# Patient Record
Sex: Female | Born: 1960 | Race: Black or African American | Hispanic: No | Marital: Married | Smoking: Never smoker
Health system: Southern US, Community
[De-identification: ages and names within clinical notes are randomized; demographics above are authoritative.]

## PROBLEM LIST (undated history)

## (undated) DIAGNOSIS — D72820 Lymphocytosis (symptomatic): Secondary | ICD-10-CM

## (undated) DIAGNOSIS — E119 Type 2 diabetes mellitus without complications: Secondary | ICD-10-CM

## (undated) DIAGNOSIS — K3184 Gastroparesis: Secondary | ICD-10-CM

## (undated) DIAGNOSIS — M1990 Unspecified osteoarthritis, unspecified site: Secondary | ICD-10-CM

## (undated) DIAGNOSIS — J309 Allergic rhinitis, unspecified: Secondary | ICD-10-CM

## (undated) DIAGNOSIS — K5790 Diverticulosis of intestine, part unspecified, without perforation or abscess without bleeding: Secondary | ICD-10-CM

## (undated) DIAGNOSIS — E785 Hyperlipidemia, unspecified: Secondary | ICD-10-CM

## (undated) DIAGNOSIS — K219 Gastro-esophageal reflux disease without esophagitis: Secondary | ICD-10-CM

## (undated) DIAGNOSIS — G43909 Migraine, unspecified, not intractable, without status migrainosus: Secondary | ICD-10-CM

## (undated) DIAGNOSIS — I10 Essential (primary) hypertension: Secondary | ICD-10-CM

## (undated) DIAGNOSIS — J45909 Unspecified asthma, uncomplicated: Secondary | ICD-10-CM

## (undated) HISTORY — DX: Hyperlipidemia, unspecified: E78.5

## (undated) HISTORY — DX: Gastroparesis: K31.84

## (undated) HISTORY — DX: Lymphocytosis (symptomatic): D72.820

## (undated) HISTORY — DX: Gastro-esophageal reflux disease without esophagitis: K21.9

## (undated) HISTORY — DX: Allergic rhinitis, unspecified: J30.9

## (undated) HISTORY — DX: Unspecified osteoarthritis, unspecified site: M19.90

## (undated) HISTORY — DX: Diverticulosis of intestine, part unspecified, without perforation or abscess without bleeding: K57.90

---

## 1998-01-18 HISTORY — PX: KNEE SURGERY: SHX244

## 1999-02-17 ENCOUNTER — Other Ambulatory Visit: Admission: RE | Admit: 1999-02-17 | Discharge: 1999-02-17 | Payer: Self-pay | Admitting: Obstetrics and Gynecology

## 2001-07-24 ENCOUNTER — Emergency Department (HOSPITAL_COMMUNITY): Admission: EM | Admit: 2001-07-24 | Discharge: 2001-07-24 | Payer: Self-pay | Admitting: *Deleted

## 2002-04-06 ENCOUNTER — Other Ambulatory Visit: Admission: RE | Admit: 2002-04-06 | Discharge: 2002-04-06 | Payer: Self-pay | Admitting: Obstetrics and Gynecology

## 2003-05-31 ENCOUNTER — Other Ambulatory Visit: Admission: RE | Admit: 2003-05-31 | Discharge: 2003-05-31 | Payer: Self-pay | Admitting: Obstetrics and Gynecology

## 2004-01-19 HISTORY — PX: ABDOMINAL HYSTERECTOMY: SHX81

## 2004-07-23 ENCOUNTER — Other Ambulatory Visit: Admission: RE | Admit: 2004-07-23 | Discharge: 2004-07-23 | Payer: Self-pay | Admitting: Obstetrics and Gynecology

## 2004-09-15 ENCOUNTER — Observation Stay (HOSPITAL_COMMUNITY): Admission: RE | Admit: 2004-09-15 | Discharge: 2004-09-16 | Payer: Self-pay | Admitting: Obstetrics and Gynecology

## 2004-09-15 ENCOUNTER — Encounter (INDEPENDENT_AMBULATORY_CARE_PROVIDER_SITE_OTHER): Payer: Self-pay | Admitting: *Deleted

## 2006-09-02 ENCOUNTER — Ambulatory Visit (HOSPITAL_COMMUNITY): Admission: RE | Admit: 2006-09-02 | Discharge: 2006-09-02 | Payer: Self-pay | Admitting: *Deleted

## 2006-09-05 ENCOUNTER — Ambulatory Visit (HOSPITAL_COMMUNITY): Admission: RE | Admit: 2006-09-05 | Discharge: 2006-09-05 | Payer: Self-pay | Admitting: *Deleted

## 2006-10-04 ENCOUNTER — Emergency Department (HOSPITAL_COMMUNITY): Admission: EM | Admit: 2006-10-04 | Discharge: 2006-10-04 | Payer: Self-pay | Admitting: Emergency Medicine

## 2008-11-11 ENCOUNTER — Encounter (INDEPENDENT_AMBULATORY_CARE_PROVIDER_SITE_OTHER): Payer: Self-pay | Admitting: *Deleted

## 2008-12-04 ENCOUNTER — Encounter (INDEPENDENT_AMBULATORY_CARE_PROVIDER_SITE_OTHER): Payer: Self-pay

## 2008-12-06 ENCOUNTER — Ambulatory Visit: Payer: Self-pay | Admitting: Internal Medicine

## 2008-12-27 ENCOUNTER — Ambulatory Visit: Payer: Self-pay | Admitting: Internal Medicine

## 2010-06-02 NOTE — Op Note (Signed)
Tara Dyer, Tara Dyer            ACCOUNT NO.:  [id number]   MEDICAL RECORD NO.:  [id number]          PATIENT TYPE:  AMB   LOCATION:  ENDO                         FACILITY:  [facility]   PHYSICIAN:  Georgiana Spinner, M.D.    DATE OF BIRTH:  [birth year]   DATE OF PROCEDURE:  09/02/2006  DATE OF DISCHARGE:                               OPERATIVE REPORT   PROCEDURE:  Upper endoscopy.   INDICATIONS:  Abdominal pain.   ANESTHESIA:  Fentanyl 50 mcg, Versed 5 mg.   DESCRIPTION OF PROCEDURE:  With the patient mildly sedated in the left  lateral decubitus position, the Pentax videoscopic endoscope was  inserted into the mouth, passed under direct vision through the  esophagus which appeared normal, into the stomach; and we saw of food in  the stomach and specifically in the fundus, but extending all the way to  the antrum.  We were able to advance past this fairly easily, through  the pylorus to the duodenal bulb, and second portion duodenum, both of  which appeared normal.  From this point the endoscope was slowly  withdrawn taking circumferential views of the duodenal mucosa until the  endoscope had been pulled back and the stomach placed in retroflexion to  view the stomach from below.   The endoscope was then straightened and withdrawn taking circumferential  views of the remaining gastric and esophageal mucosa; stopping to take  photographs along the way.  We saw most of the stomach, except for the  fundus, which was covered with food.  The endoscope was withdrawn.  The  patient's vital signs and pulse oximeter remained stable.  The patient  tolerated the procedure well without apparent complications.   FINDINGS:  Food in stomach indicating some degree of gastroparesis.   PLAN:  We will schedule the patient for a gastric emptying study, and  consideration for treatment depending on results.           ______________________________  Georgiana Spinner, M.D.     GMO/MEDQ  D:  09/02/2006   T:  09/02/2006  Job:  [id number]

## 2010-06-05 NOTE — H&P (Signed)
Tara Dyer, Tara Dyer            ACCOUNT NO.:  [id number]   MEDICAL RECORD NO.:  [id number]          PATIENT TYPE:  AMB   LOCATION:  SDC                           FACILITY:  [facility]   PHYSICIAN:  Duke Salvia. Marcelle Overlie, M.D.DATE OF BIRTH:  02/05/1960   DATE OF ADMISSION:  09/15/2004  DATE OF DISCHARGE:                                HISTORY & PHYSICAL   CHIEF COMPLAINT:  Menorrhagia, dysmenorrhea.   HISTORY OF PRESENT ILLNESS:  A 50 year old, G0, P0 with a history of  worsening dysmenorrhea and menorrhagia secondary to leiomyoma and presents  now for [facility].  She has been using OCPs for contraception.  Recent evaluation  by ultrasound on July 02, 2004, in our office showed fibroids 5.2 x 4.6 and  3.2 x 2.8 with adnexa unremarkable.  We reviewed the procedure of LSH  including the risk of bleeding, infection, transfusion, the possible need  for open or additional surgery.  She would prefer to have her otherwise  normal ovaries conserved, but consents to BSO if there is an abnormality.  She understands that with [facility] there is a small chance of continued light  monthly bleeding.  She has not had a history of abnormal cytology.  The last  Pap smear in July 2006, was normal.  She has a history in 1994, of LEEP for  mild dysplasia with clear margins with normal followup since that time.  Dr.  Lendell Caprice is her medical doctor and has her on several blood pressure  medicines along with potassium supplements.   ALLERGIES:  ERYTHROMYCIN.   CURRENT MEDICATIONS:  HCTZ, Topamax, Kay Ciel and Trivora.   FAMILY HISTORY:  Significant for a mother with diabetes.   PHYSICAL EXAMINATION:  VITAL SIGNS:  Temperature 98.2, blood pressure  120/70.  HEENT:  Unremarkable.  NECK:  Supple without mass.  CARDIAC:  Regular rate and rhythm without murmurs, rubs or gallops.  LUNGS:  Clear.  BREASTS:  Without masses.  ABDOMEN:  Soft, flat, nontender.  PELVIC:  Vulva, vagina and cervix normal.  Uterus was 6-week size  and mid  position.  Adnexa negative.  EXTREMITIES:  Unremarkable.  NEUROLOGIC:  Unremarkable.   IMPRESSION:  Symptomatic leiomyoma.   PLAN:  LSH.  Procedure and risks reviewed as above.     Richard M. Marcelle Overlie, M.D.  Electronically Signed    RMH/MEDQ  D:  09/08/2004  T:  09/08/2004  Job:  [id number]

## 2010-06-05 NOTE — Discharge Summary (Signed)
Tara Dyer, Tara Dyer            ACCOUNT NO.:  [id number]   MEDICAL RECORD NO.:  [id number]          PATIENT TYPE:  OBV   LOCATION:  9309                          FACILITY:  [facility]   PHYSICIAN:  Duke Salvia. Marcelle Overlie, M.D.DATE OF BIRTH:  [birth year]   DATE OF ADMISSION:  09/15/2004  DATE OF DISCHARGE:  09/16/2004                                 DISCHARGE SUMMARY   DISCHARGE DIAGNOSES:  1.  Symptomatic leiomyoma.  2.  LSH this admission.   For summary of the history and physical examination, please see admission  history and physical for details.  Briefly, a 50 year old G0, P0 with  symptomatic leiomyoma presents for [facility].   HOSPITAL COURSE:  On August 29 under general anesthesia the patient  underwent LSH with an EBL of 100 mL.  On the a.m. of the first postoperative  day the catheter was removed.  She was voiding without difficulty, was  afebrile, tolerating a regular diet, ambulating, and ready for discharge at  that point.   LABORATORY DATA:  Preoperative laboratory work:  CMET normal except for  potassium 3.4.  Hemoglobin 12.3, hematocrit 36 with a UA negative except for  small bilirubin.  Postoperative hemoglobin on August 30 10.1, WBC 8.5.   DISPOSITION:  Patient discharged on Tylox p.r.n. pain.  Will return to our  office in one week.  Advised to report any increased vaginal bleeding, fever  over 101, persistent nausea/vomiting, or urinary complaints.  She will be  given specific instructions regarding diet, sex, exercise.  She will resume  her routine medications.   CONDITION ON DISCHARGE:  Good.   ACTIVITY:  Graded increase.      Richard M. Marcelle Overlie, M.D.  Electronically Signed     RMH/MEDQ  D:  09/16/2004  T:  09/16/2004  Job:  [id number]

## 2010-06-05 NOTE — Op Note (Signed)
[name], Tara Dyer            ACCOUNT NO.:  [id number]   MEDICAL RECORD NO.:  [id number]          PATIENT TYPE:  OBV   LOCATION:  9399                          FACILITY:  [facility]   PHYSICIAN:  Duke Salvia. Marcelle Overlie, M.D.DATE OF BIRTH:  11/19/1960   DATE OF PROCEDURE:  09/15/2004  DATE OF DISCHARGE:                                 OPERATIVE REPORT   PREOPERATIVE DIAGNOSIS:  Menorrhagia with mild anemia and leiomyoma.   POSTOPERATIVE DIAGNOSIS:  Menorrhagia with mild anemia and leiomyoma.   PROCEDURE:  LSH.   SURGEON:  Richarda Overlie, M.D.   ASSISTANT:  Richardean Chimera, M.D.   ANESTHESIA:  General endotracheal.   COMPLICATIONS:  None.   DRAINS:  Foley catheter.   ESTIMATED BLOOD LOSS:  Less than 100 mL.   SPECIMENS:  Uterine fundus.   DESCRIPTION OF PROCEDURE:  The patient was taken to the operating room and  after an adequate level of general endotracheal anesthesia was obtained with  the patient's legs in stirrups, the abdomen, perineum, and vagina were  prepped and draped in the usual fashion for laparoscopic procedures.  The  bladder was drained with a Foley catheter.  Uterus was 8 weeks size,  midposition, adnexa unremarkable.  The subumbilical area was infiltrated  with 0.5% Marcaine plain.  A small incision was made, the Veress needle was  introduced without difficulty.  Its intra-abdominal position was verified by  pressure and water testing.  A 3-liter pneumoperitoneum was then created.  Laparoscopic trocar and sleeve were then introduced without difficulty.  Medial to the anterior iliac crest in an area of negative transillumination,  a 10 mm bladeless trocar was inserted on either side.  The patient was then  placed in Trendelenburg.  The uterus itself was 8 weeks size, symmetrically  enlarged with leiomyoma.  Both ovaries were unremarkable.  The cul-de-sac  was free and clear.  Upper abdomen was unremarkable.  Tenaculum was used by  the assistant to place the uterus  on traction.  The utero-ovarian pedicle  was coagulated and cut with the Harmonic Ace down to and including the round  ligament.  The peritoneum was then divided to the midline.  The exact same  was repeated on the opposite side.  Once this was completed, the bladder  area was dissected a short distance and the blunt dissection was pushed  slightly below.  The uterine vasculature pedicles were then skeletonized and  then coagulated and cut with the Harmonic Ace close to the uterus.  The  Harmonic scalpel was then used to excise the fundus of the uterus in a plane  above the bladder reflection.  Once this was completed, the canal was  coagulated on minimal with the Harmonic scalpel.  The morcellator was  introduced in the left lower quadrant and the uterine specimen was  morselized after this was completed.  Careful inspection and irrigation  assured that all small fragments were removed.  The operative site was then  irrigated and noted to be hemostatic.  Both tubes and ovaries  were normal.  Interceed was placed across the cervical stump.  Instruments  were removed.  Gas was allowed to escape.  The fascia in the left lower  incision was closed with a 2-0 Vicryl suture.  Dermabond and 4-0 Vicryl  Rapide subcuticular on the umbilical incision.  She tolerated this well and  went to the recovery room in good condition.           ______________________________  Duke Salvia. Marcelle Overlie, M.D.     RMH/MEDQ  D:  09/15/2004  T:  09/15/2004  Job:  [id number]

## 2010-10-29 LAB — CBC
Hemoglobin: 12.3
RDW: 14
WBC: 6.6

## 2010-10-29 LAB — BASIC METABOLIC PANEL
Calcium: 9.1
GFR calc Af Amer: >60
GFR calc non Af Amer: >60
Glucose, Bld: 93
Sodium: 133 — ABNORMAL LOW

## 2010-10-29 LAB — POCT CARDIAC MARKERS
CKMB, poc: 3.7
Operator id: 4661
Troponin i, poc: <0.05

## 2010-10-29 LAB — DIFFERENTIAL
Basophils Absolute: 0
Lymphocytes Relative: 55 — ABNORMAL HIGH
Monocytes Absolute: 0.6
Neutro Abs: 2.2

## 2012-09-15 ENCOUNTER — Emergency Department (HOSPITAL_COMMUNITY)
Admission: EM | Admit: 2012-09-15 | Discharge: 2012-09-15 | Disposition: A | Discharge status: Home / Self Care | Payer: BC Managed Care – PPO | Attending: Emergency Medicine | Admitting: Emergency Medicine

## 2012-09-15 ENCOUNTER — Encounter (HOSPITAL_COMMUNITY): Payer: Self-pay | Admitting: Emergency Medicine

## 2012-09-15 DIAGNOSIS — R209 Unspecified disturbances of skin sensation: Secondary | ICD-10-CM | POA: Insufficient documentation

## 2012-09-15 DIAGNOSIS — I10 Essential (primary) hypertension: Secondary | ICD-10-CM | POA: Insufficient documentation

## 2012-09-15 DIAGNOSIS — M25519 Pain in unspecified shoulder: Secondary | ICD-10-CM | POA: Insufficient documentation

## 2012-09-15 DIAGNOSIS — J45909 Unspecified asthma, uncomplicated: Secondary | ICD-10-CM | POA: Insufficient documentation

## 2012-09-15 DIAGNOSIS — M549 Dorsalgia, unspecified: Secondary | ICD-10-CM | POA: Insufficient documentation

## 2012-09-15 DIAGNOSIS — Z79899 Other long term (current) drug therapy: Secondary | ICD-10-CM | POA: Insufficient documentation

## 2012-09-15 DIAGNOSIS — M436 Torticollis: Secondary | ICD-10-CM

## 2012-09-15 DIAGNOSIS — E119 Type 2 diabetes mellitus without complications: Secondary | ICD-10-CM | POA: Insufficient documentation

## 2012-09-15 DIAGNOSIS — Z72820 Sleep deprivation: Secondary | ICD-10-CM | POA: Insufficient documentation

## 2012-09-15 DIAGNOSIS — R5381 Other malaise: Secondary | ICD-10-CM | POA: Insufficient documentation

## 2012-09-15 HISTORY — DX: Type 2 diabetes mellitus without complications: E11.9

## 2012-09-15 HISTORY — DX: Unspecified asthma, uncomplicated: J45.909

## 2012-09-15 HISTORY — DX: Essential (primary) hypertension: I10

## 2012-09-15 MED ORDER — NAPROXEN 500 MG PO TABS: 500.0000 mg | ORAL_TABLET | Freq: Two times a day (BID) | ORAL | Status: DC

## 2012-09-15 MED ORDER — METHOCARBAMOL 500 MG PO TABS: 500.0000 mg | ORAL_TABLET | Freq: Two times a day (BID) | ORAL | Status: DC

## 2012-09-15 MED ORDER — HYDROCODONE-ACETAMINOPHEN 5-325 MG PO TABS: 2.0000 | ORAL_TABLET | ORAL | Status: DC | PRN

## 2012-09-15 NOTE — ED Provider Notes (Signed)
CSN: [id number]     Arrival date & time 09/15/12  8295 History   First MD Initiated Contact with Patient 09/15/12 [phone]     Chief Complaint  Patient presents with  . Back Pain  . Shoulder Pain  . Neck Pain    HPI   Patient had a viral like illness with vomiting and diarrhea and about 10 days ago. Is not febrile. States he felt better within about 24 hours but had 2 days where she slept poorly. She waking him second morning and her neck was stiff and sore. Her symptoms resolved. She remained stiff and sore with her neck. She states looking forward she seems fine, however rotating or bending or flexion chest pain. Seemed to start in the paraspinal muscles in the back of the neck. It does not have meningismus with pain down her spine. Radiates to her shoulders but she does have heavy dish use, numbness, weakness, or tingling to her upper extremities. No difficulty with fine motors. Past Medical History  Diagnosis Date  . Diabetes mellitus without complication   . Hypertension   . Asthma    Past Surgical History  Procedure Laterality Date  . Abdominal hysterectomy     No family history on file. History  Substance Use Topics  . Smoking status: Never Smoker   . Smokeless tobacco: Not on file  . Alcohol Use: No   OB History   Grav Para Term Preterm Abortions TAB SAB Ect Mult Living                 Review of Systems  Constitutional: Negative for fever, chills, diaphoresis, appetite change and fatigue.  HENT: Positive for neck pain and neck stiffness. Negative for sore throat, mouth sores and trouble swallowing.   Eyes: Negative for visual disturbance.  Respiratory: Negative for cough, chest tightness, shortness of breath and wheezing.   Cardiovascular: Negative for chest pain.  Gastrointestinal: Negative for nausea, vomiting, abdominal pain, diarrhea and abdominal distention.  Endocrine: Negative for polydipsia, polyphagia and polyuria.  Genitourinary: Negative for dysuria,  frequency and hematuria.  Musculoskeletal: Positive for myalgias, back pain and arthralgias. Negative for gait problem.  Skin: Negative for color change, pallor and rash.  Neurological: Negative for dizziness, syncope, weakness, light-headedness, numbness and headaches.  Hematological: Does not bruise/bleed easily.  Psychiatric/Behavioral: Negative for behavioral problems and confusion.    Allergies  Review of patient's allergies indicates not on file.  Home Medications   Current Outpatient Rx  Name  Route  Sig  Dispense  Refill  . HYDROcodone-acetaminophen (NORCO/VICODIN) 5-325 MG per tablet   Oral   Take 2 tablets by mouth every 4 (four) hours as needed for pain.   10 tablet   0   . methocarbamol (ROBAXIN) 500 MG tablet   Oral   Take 1 tablet (500 mg total) by mouth 2 (two) times daily.   20 tablet   0   . naproxen (NAPROSYN) 500 MG tablet   Oral   Take 1 tablet (500 mg total) by mouth 2 (two) times daily.   30 tablet   0    BP 163/99  Pulse 88  Temp(Src) 97.6 F (36.4 C) (Oral)  Resp 17  SpO2 99% Physical Exam  Constitutional: She is oriented to person, place, and time. She appears well-developed and well-nourished. No distress.  HENT:  Head: Normocephalic.  Eyes: Conjunctivae are normal. Pupils are equal, round, and reactive to light. No scleral icterus.  Neck: Normal range of motion. Neck  supple. No thyromegaly present.  Chest palpation of the paraspinal musculature. No suboccipital pain. She substitutes with shoulder shrug with a temperature rotate. As a painful arc suggestive of rotator cuff disease. Lhermitte's  Phenomenon. Negative Spurling.    Cardiovascular: Normal rate and regular rhythm.  Exam reveals no gallop and no friction rub.   No murmur heard. Pulmonary/Chest: Effort normal and breath sounds normal. No respiratory distress. She has no wheezes. She has no rales.  Abdominal: Soft. Bowel sounds are normal. She exhibits no distension. There is no  tenderness. There is no rebound.  Musculoskeletal: Normal range of motion.  Neurological: She is alert and oriented to person, place, and time.  Strength to shoulder shrug, flex and extend the elbow, flex extend the wrist, fine motor prescription strength wrist extension.  Strength and sensation throughout her extremities.  Skin: Skin is warm and dry. No rash noted.  Psychiatric: She has a normal mood and affect. Her behavior is normal.    ED Course  Procedures (including critical care time) Labs Review Labs Reviewed - No data to display Imaging Review No results found.  MDM   1. 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 Plan anti-inflammatories pain medication muscle relaxants and physical therapy primary care followup    Claudean Kinds, MD 09/15/12 [phone]

## 2012-09-15 NOTE — ED Notes (Signed)
Pt states that she has shoulder, upper back, neck and upper arm pain that has been going on four weeks now. Pt has been going to her PCP but nothing that he has given her or she has tried has relieved the pain.  She has tried heating pad, cold applications, creams and nothing will help.

## 2013-01-31 ENCOUNTER — Other Ambulatory Visit: Payer: Self-pay | Admitting: Obstetrics and Gynecology

## 2013-08-21 ENCOUNTER — Other Ambulatory Visit: Payer: Self-pay | Admitting: Obstetrics and Gynecology

## 2013-08-22 LAB — CYTOLOGY - PAP

## 2013-10-06 ENCOUNTER — Emergency Department (HOSPITAL_COMMUNITY): Payer: BC Managed Care – PPO

## 2013-10-06 ENCOUNTER — Emergency Department (HOSPITAL_COMMUNITY)
Admission: EM | Admit: 2013-10-06 | Discharge: 2013-10-06 | Disposition: A | Discharge status: Home / Self Care | Payer: BC Managed Care – PPO | Attending: Emergency Medicine | Admitting: Emergency Medicine

## 2013-10-06 ENCOUNTER — Encounter (HOSPITAL_COMMUNITY): Payer: Self-pay | Admitting: Emergency Medicine

## 2013-10-06 DIAGNOSIS — S0993XA Unspecified injury of face, initial encounter: Secondary | ICD-10-CM | POA: Diagnosis present

## 2013-10-06 DIAGNOSIS — S199XXA Unspecified injury of neck, initial encounter: Secondary | ICD-10-CM

## 2013-10-06 DIAGNOSIS — Y9389 Activity, other specified: Secondary | ICD-10-CM | POA: Diagnosis not present

## 2013-10-06 DIAGNOSIS — J45909 Unspecified asthma, uncomplicated: Secondary | ICD-10-CM | POA: Insufficient documentation

## 2013-10-06 DIAGNOSIS — I10 Essential (primary) hypertension: Secondary | ICD-10-CM | POA: Insufficient documentation

## 2013-10-06 DIAGNOSIS — Z79899 Other long term (current) drug therapy: Secondary | ICD-10-CM | POA: Diagnosis not present

## 2013-10-06 DIAGNOSIS — E119 Type 2 diabetes mellitus without complications: Secondary | ICD-10-CM | POA: Insufficient documentation

## 2013-10-06 DIAGNOSIS — S43402A Unspecified sprain of left shoulder joint, initial encounter: Secondary | ICD-10-CM

## 2013-10-06 DIAGNOSIS — Y92410 Unspecified street and highway as the place of occurrence of the external cause: Secondary | ICD-10-CM | POA: Insufficient documentation

## 2013-10-06 DIAGNOSIS — S161XXA Strain of muscle, fascia and tendon at neck level, initial encounter: Secondary | ICD-10-CM

## 2013-10-06 DIAGNOSIS — IMO0002 Reserved for concepts with insufficient information to code with codable children: Secondary | ICD-10-CM | POA: Diagnosis not present

## 2013-10-06 DIAGNOSIS — S139XXA Sprain of joints and ligaments of unspecified parts of neck, initial encounter: Secondary | ICD-10-CM | POA: Insufficient documentation

## 2013-10-06 DIAGNOSIS — G43909 Migraine, unspecified, not intractable, without status migrainosus: Secondary | ICD-10-CM | POA: Insufficient documentation

## 2013-10-06 HISTORY — DX: Migraine, unspecified, not intractable, without status migrainosus: G43.909

## 2013-10-06 MED ORDER — METHOCARBAMOL 500 MG PO TABS
1000.0000 mg | ORAL_TABLET | Freq: Once | ORAL | Status: AC
  Administered 2013-10-06: 1000 mg via ORAL
  Filled 2013-10-06: qty 2

## 2013-10-06 MED ORDER — METHOCARBAMOL 500 MG PO TABS: 1000.0000 mg | ORAL_TABLET | Freq: Four times a day (QID) | ORAL | Status: DC | PRN

## 2013-10-06 MED ORDER — IBUPROFEN 200 MG PO TABS
400.0000 mg | ORAL_TABLET | Freq: Once | ORAL | Status: AC
  Administered 2013-10-06: 400 mg via ORAL
  Filled 2013-10-06: qty 2

## 2013-10-06 NOTE — Discharge Instructions (Signed)
For pain control you may take up to 800mg of Motrin (also known as ibuprofen). That is usually 4 over the counter pills,  3 times a day. Take with food to minimize stomach irritation  ° °You can also take  tylenol (acetaminophen) 975mg (this is 3 over the counter pills) four times a day. Do not drink alcohol or combine with other medications that have acetaminophen as an ingredient (Read the labels!).   ° °For breakthrough pain you may take Robaxin. Do not drink alcohol, drive or operate heavy machinery when taking Robaxin. ° °Please follow with your primary care doctor in the next 2 days for a check-up. They must obtain records for further management.  ° °Do not hesitate to return to the Emergency Department for any new, worsening or concerning symptoms.  ° ° °Cervical Sprain °A cervical sprain is an injury in the neck in which the strong, fibrous tissues (ligaments) that connect your neck bones stretch or tear. Cervical sprains can range from mild to severe. Severe cervical sprains can cause the neck vertebrae to be unstable. This can lead to damage of the spinal cord and can result in serious nervous system problems. The amount of time it takes for a cervical sprain to get better depends on the cause and extent of the injury. Most cervical sprains heal in 1 to 3 weeks. °CAUSES  °Severe cervical sprains may be caused by:  °· Contact sport injuries (such as from football, rugby, wrestling, hockey, auto racing, gymnastics, diving, martial arts, or boxing).   °· Motor vehicle collisions.   °· Whiplash injuries. This is an injury from a sudden forward and backward whipping movement of the head and neck.  °· Falls.   °Mild cervical sprains may be caused by:  °· Being in an awkward position, such as while cradling a telephone between your ear and shoulder.   °· Sitting in a chair that does not offer proper support.   °· Working at a poorly designed computer station.   °· Looking up or down for long periods of time.    °SYMPTOMS  °· Pain, soreness, stiffness, or a burning sensation in the front, back, or sides of the neck. This discomfort may develop immediately after the injury or slowly, 24 hours or more after the injury.   °· Pain or tenderness directly in the middle of the back of the neck.   °· Shoulder or upper back pain.   °· Limited ability to move the neck.   °· Headache.   °· Dizziness.   °· Weakness, numbness, or tingling in the hands or arms.   °· Muscle spasms.   °· Difficulty swallowing or chewing.   °· Tenderness and swelling of the neck.   °DIAGNOSIS  °Most of the time your health care provider can diagnose a cervical sprain by taking your history and doing a physical exam. Your health care provider will ask about previous neck injuries and any known neck problems, such as arthritis in the neck. X-rays may be taken to find out if there are any other problems, such as with the bones of the neck. Other tests, such as a CT scan or MRI, may also be needed.  °TREATMENT  °Treatment depends on the severity of the cervical sprain. Mild sprains can be treated with rest, keeping the neck in place (immobilization), and pain medicines. Severe cervical sprains are immediately immobilized. Further treatment is done to help with pain, muscle spasms, and other symptoms and may include: °· Medicines, such as pain relievers, numbing medicines, or muscle relaxants.   °· Physical therapy. This may involve stretching   exercises, strengthening exercises, and posture training. Exercises and improved posture can help stabilize the neck, strengthen muscles, and help stop symptoms from returning.   °HOME CARE INSTRUCTIONS  °· Put ice on the injured area.   °¨ Put ice in a plastic bag.   °¨ Place a towel between your skin and the bag.   °¨ Leave the ice on for 15-20 minutes, 3-4 times a day.   °· If your injury was severe, you may have been given a cervical collar to wear. A cervical collar is a two-piece collar designed to keep your neck  from moving while it heals. °¨ Do not remove the collar unless instructed by your health care provider. °¨ If you have long hair, keep it outside of the collar. °¨ Ask your health care provider before making any adjustments to your collar. Minor adjustments may be required over time to improve comfort and reduce pressure on your chin or on the back of your head. °¨ If you are allowed to remove the collar for cleaning or bathing, follow your health care provider's instructions on how to do so safely. °¨ Keep your collar clean by wiping it with mild soap and water and drying it completely. If the collar you have been given includes removable pads, remove them every 1-2 days and hand wash them with soap and water. Allow them to air dry. They should be completely dry before you wear them in the collar. °¨ If you are allowed to remove the collar for cleaning and bathing, wash and dry the skin of your neck. Check your skin for irritation or sores. If you see any, tell your health care provider. °¨ Do not drive while wearing the collar.   °· Only take over-the-counter or prescription medicines for pain, discomfort, or fever as directed by your health care provider.   °· Keep all follow-up appointments as directed by your health care provider.   °· Keep all physical therapy appointments as directed by your health care provider.   °· Make any needed adjustments to your workstation to promote good posture.   °· Avoid positions and activities that make your symptoms worse.   °· Warm up and stretch before being active to help prevent problems.   °SEEK MEDICAL CARE IF:  °· Your pain is not controlled with medicine.   °· You are unable to decrease your pain medicine over time as planned.   °· Your activity level is not improving as expected.   °SEEK IMMEDIATE MEDICAL CARE IF:  °· You develop any bleeding. °· You develop stomach upset. °· You have signs of an allergic reaction to your medicine.   °· Your symptoms get worse.    °· You develop new, unexplained symptoms.   °· You have numbness, tingling, weakness, or paralysis in any part of your body.   °MAKE SURE YOU:  °· Understand these instructions. °· Will watch your condition. °· Will get help right away if you are not doing well or get worse. °Document Released: 11/01/2006 Document Revised: 01/09/2013 Document Reviewed: 07/12/2012 °[employer]® Patient Information ©2015 [employer]. This information is not intended to replace advice given to you by your health care provider. Make sure you discuss any questions you have with your health care provider. ° ° °
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
## 2013-10-07 NOTE — ED Provider Notes (Signed)
Medical screening examination/treatment/procedure(s) were performed by non-physician practitioner and as supervising physician I was immediately available for consultation/collaboration.   EKG Interpretation None        Tzippy Testerman M Nas Wafer, MD 10/07/13 0136 

## 2014-05-08 ENCOUNTER — Encounter: Payer: Self-pay | Admitting: Internal Medicine

## 2014-05-23 ENCOUNTER — Ambulatory Visit: Payer: BC Managed Care – PPO | Admitting: Nurse Practitioner

## 2014-05-31 ENCOUNTER — Encounter: Payer: Self-pay | Admitting: Gastroenterology

## 2014-06-05 ENCOUNTER — Ambulatory Visit: Payer: BC Managed Care – PPO | Admitting: Nurse Practitioner

## 2014-08-09 ENCOUNTER — Ambulatory Visit: Payer: BC Managed Care – PPO | Admitting: Internal Medicine

## 2014-08-26 ENCOUNTER — Other Ambulatory Visit: Payer: Self-pay | Admitting: Obstetrics and Gynecology

## 2014-08-27 LAB — CYTOLOGY - PAP

## 2014-10-14 ENCOUNTER — Encounter: Payer: Self-pay | Admitting: Internal Medicine

## 2014-10-14 ENCOUNTER — Ambulatory Visit (INDEPENDENT_AMBULATORY_CARE_PROVIDER_SITE_OTHER): Payer: BC Managed Care – PPO | Admitting: Internal Medicine

## 2014-10-14 VITALS — BP 140/80 | HR 78 | Ht 64.0 in | Wt 177.0 lb

## 2014-10-14 DIAGNOSIS — R195 Other fecal abnormalities: Secondary | ICD-10-CM

## 2014-10-14 DIAGNOSIS — K219 Gastro-esophageal reflux disease without esophagitis: Secondary | ICD-10-CM

## 2014-10-14 DIAGNOSIS — R1030 Lower abdominal pain, unspecified: Secondary | ICD-10-CM | POA: Diagnosis not present

## 2014-10-14 DIAGNOSIS — R140 Abdominal distension (gaseous): Secondary | ICD-10-CM

## 2014-10-14 MED ORDER — HYOSCYAMINE SULFATE 0.125 MG SL SUBL: 0.1250 mg | SUBLINGUAL_TABLET | SUBLINGUAL | Status: AC | PRN

## 2014-10-14 NOTE — Patient Instructions (Signed)
We have sent the following medications to your pharmacy for you to pick up at your convenience: Levsin  Please purchase the following medications over the counter and take as directed: Citrucel take 1 heaping tablespoon with water or juice daily  Please follow up as needed

## 2014-10-14 NOTE — Progress Notes (Signed)
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 Pearson Grippe, regarding abdominal pain and alternating bowel habits. The patient was last seen 12/27/2008 when she underwent routine screening colonoscopy. The examination was normal. Her current history is that of intermittent postprandial lower abdominal cramping associated with nausea. Occasionally severe and associated with diaphoresis. Thereafter, her bowels tend to be loose with urgency. Defecation helps relieve symptoms. She describes proximate 2 severe episodes per month. Less severe episodes approximate 3 times per week. She states that she has a bowel movement every other day. Bowel movements may be formed or somewhat loose. She denies bleeding or weight loss. No fevers. She does have some bloating which is helped with Gas-X. Continue other GI complaint is mild intermittent reflux without dysphagia which is treated adequately with on demand Pepcid. The patient has been on metformin for proximate 2 years does not relate this medication to current bowel complaints.  REVIEW OF SYSTEMS:  All non-GI ROS negative except for arthritis, sinus and allergy troubles  Past Medical History  Diagnosis Date  . Diabetes mellitus without complication   . Hypertension   . Asthma   . Migraines   . HLD (hyperlipidemia)   . Lymphocytosis   . GERD (gastroesophageal reflux disease)   . Gastroparesis   . Diverticulosis   . Allergic rhinitis     Past Surgical History  Procedure Laterality Date  . Abdominal hysterectomy    . Knee surgery Right     Social History Tara Dyer  reports that she has never smoked. She does not have any smokeless tobacco history on file. She reports that she does not drink alcohol. Her drug history is not on file.  family history includes Diabetes in her mother.  Allergies  Allergen  Reactions  . Ace Inhibitors Swelling  . Erythromycin Nausea And Vomiting       PHYSICAL EXAMINATION: Vital signs: BP 140/80 mmHg  Pulse 78  Ht  (1.626 m)  Wt 177 lb (80.287 kg)  BMI 30.37 kg/m2  Constitutional: Obese, generally well-appearing, no acute distress Psychiatric: alert and oriented x3, cooperative Eyes: extraocular movements intact, anicteric, conjunctiva pink Mouth: oral pharynx moist, no lesions Neck: supple without thyromegaly Lymph: no lymphadenopathy Cardiovascular: heart regular rate and rhythm, no murmur Lungs: clear to auscultation bilaterally Abdomen: soft, obese, nontender, nondistended, no obvious ascites, no peritoneal signs, normal bowel sounds, no organomegaly Rectal: Deferred Extremities: no clubbing cyanosis or lower extremity edema bilaterally Skin: no lesions on visible extremities Neuro: No focal deficits. No asterixis.  ASSESSMENT:  #1. Chronic lower GI complaints manifested by postprandial cramping with urgency and loose stools intermittently. No alarm features. Normal colonoscopy 2010. Most consistent with IBS. #2. Increased intestinal gas. Managed with Gas-X #3. GERD. Mild without alarm features   PLAN:  #1. Discussion on IBS #2. Recommend Citrucel one heaping tablespoon daily #3. Prescribed Levsin sublingual 0.125 mg. 1-2 sublingual every 4 hours when necessary cramping #4. Reflux precautions with attention to weight loss #5. Gas-X on demand for bloating #6. GI follow-up for assistant or worsening symptoms. Otherwise follow-up as needed if this is helpful  A copy of this consultation and has been sent to Dr. Selena Batten

## 2018-01-03 ENCOUNTER — Encounter: Payer: Self-pay | Admitting: Internal Medicine

## 2018-01-31 ENCOUNTER — Ambulatory Visit: Payer: BC Managed Care – PPO | Admitting: Internal Medicine

## 2018-02-13 ENCOUNTER — Ambulatory Visit: Payer: BC Managed Care – PPO | Admitting: Internal Medicine

## 2018-02-13 ENCOUNTER — Encounter: Payer: Self-pay | Admitting: Internal Medicine

## 2018-02-13 VITALS — BP 160/94 | HR 76 | Ht 63.5 in | Wt 183.4 lb

## 2018-02-13 DIAGNOSIS — R1013 Epigastric pain: Secondary | ICD-10-CM

## 2018-02-13 DIAGNOSIS — K5900 Constipation, unspecified: Secondary | ICD-10-CM | POA: Diagnosis not present

## 2018-02-13 DIAGNOSIS — Z1211 Encounter for screening for malignant neoplasm of colon: Secondary | ICD-10-CM

## 2018-02-13 DIAGNOSIS — K219 Gastro-esophageal reflux disease without esophagitis: Secondary | ICD-10-CM | POA: Diagnosis not present

## 2018-02-13 MED ORDER — OMEPRAZOLE 40 MG PO CPDR: 40.0000 mg | DELAYED_RELEASE_CAPSULE | Freq: Every day | ORAL | 3 refills | Status: DC

## 2018-02-13 MED ORDER — NA SULFATE-K SULFATE-MG SULF 17.5-3.13-1.6 GM/177ML PO SOLN: 1.0000 | Freq: Once | ORAL | 0 refills | Status: AC

## 2018-02-13 NOTE — Progress Notes (Signed)
HISTORY OF PRESENT ILLNESS:  Tara Dyer is a 58 y.o. female who is self-referred regarding problems with abdominal pain and constipation.  The patient was last seen in this office September 2016 regarding chronic lower GI complaints as well as increased intestinal gas and GERD.  See that dictation.  Previous colonoscopy 2010 was normal.  Patient tells me that she has had epigastric pain for about 3 months.  This occurs daily.  Typically last 30 minutes.  Meals may make the discomfort better or worse.  Occasionally improved with defecation but not always.  She does have active reflux symptoms including pyrosis and regurgitation.  No dysphasia.  No prior history of abdominal surgeries other than hysterectomy.  Her weight has been stable.  She takes Citrucel.  Typically has 2 bowel movements per week.  She does experience bloating.  She is concerned.  No relevant abnormalities of laboratories or x-rays upon file review.  REVIEW OF SYSTEMS:  All non-GI ROS negative unless otherwise stated in the HPI except for sinus and allergy trouble, arthritis in her neck, night sweats  Past Medical History:  Diagnosis Date  . Allergic rhinitis   . Arthritis   . Asthma   . Diabetes mellitus without complication (HCC)   . Diverticulosis   . Gastroparesis   . GERD (gastroesophageal reflux disease)   . HLD (hyperlipidemia)   . Hypertension   . Lymphocytosis   . Migraines     Past Surgical History:  Procedure Laterality Date  . ABDOMINAL HYSTERECTOMY  2006  . KNEE SURGERY Right 2000    Social History Tara GrammesMelonia Belsito  reports that she has never smoked. She has never used smokeless tobacco. She reports that she does not drink alcohol or use drugs.  family history includes Breast cancer in her niece; Diabetes in her brother, mother, and sister; Irritable bowel syndrome in her sister; Ovarian cancer in her mother.  Allergies  Allergen Reactions  . Ace Inhibitors Swelling  . Erythromycin Nausea And  Vomiting       PHYSICAL EXAMINATION: Vital signs: BP (!) 160/94 (BP Location: Left Arm, Patient Position: Sitting, Cuff Size: Normal)   Pulse 76   Ht 5' 3.5" (1.613 m) Comment: height measured without shoes  Wt 183 lb 6 oz (83.2 kg)   BMI 31.97 kg/m   Constitutional: generally well-appearing, no acute distress Psychiatric: alert and oriented x3, cooperative Eyes: extraocular movements intact, anicteric, conjunctiva pink Mouth: oral pharynx moist, no lesions Neck: supple no lymphadenopathy Cardiovascular: heart regular rate and rhythm, no murmur Lungs: clear to auscultation bilaterally Abdomen: soft, nontender, nondistended, no obvious ascites, no peritoneal signs, normal bowel sounds, no organomegaly Rectal: Deferred until colonoscopy Extremities: no clubbing, cyanosis, or lower extremity edema bilaterally Skin: no lesions on visible extremities Neuro: No focal deficits.  Cranial nerves intact  ASSESSMENT:  1.  1634-month history of daily epigastric pain.  Rule out GERD.  Rule out peptic ulcer disease.  Rule out gallbladder disease.  Rule out pancreas abnormalities 2.  Chronic constipation.  Ongoing 3.  Obesity 4.  GERD by history  PLAN:  1.  Prescribe omeprazole 40 mg daily 2.  MiraLAX 1 dose daily.  Adjust to achieve desired result 3.  Schedule upper endoscopy to evaluate persistent epigastric pain.The nature of the procedure, as well as the risks, benefits, and alternatives were carefully and thoroughly reviewed with the patient. Ample time for discussion and questions allowed. The patient understood, was satisfied, and agreed to proceed. 4.  Schedule abdominal ultrasound to evaluate  persistent epigastric pain 5.  Schedule colonoscopy as she is due for screening this year as well as evaluate general GI complaints of constipation associated with abdominal bloating discomfort. The nature of the procedure, as well as the risks, benefits, and alternatives were carefully and  thoroughly reviewed with the patient. Ample time for discussion and questions allowed. The patient understood, was satisfied, and agreed to proceed. She may be a candidate for Linzess..Marland Kitchen

## 2018-02-13 NOTE — Patient Instructions (Signed)
We have sent the following medications to your pharmacy for you to pick up at your convenience:  Omeprazole  You have been scheduled for an abdominal ultrasound at [facility] (1st floor of hospital) on 2/6/2020at 9:00am. Please arrive 15 minutes prior to your appointment for registration. Make certain not to have anything to eat or drink after midnight prior to your appointment. Should you need to reschedule your appointment, please contact radiology at [phone]. This test typically takes about 30 minutes to perform.  You have been scheduled for an endoscopy and colonoscopy. Please follow the written instructions given to you at your visit today. Please pick up your prep supplies at the pharmacy within the next 1-3 days. If you use inhalers (even only as needed), please bring them with you on the day of your procedure. Your physician has requested that you go to www.startemmi.com and enter the access code given to you at your visit today. This web site gives a general overview about your procedure. However, you should still follow specific instructions given to you by our office regarding your preparation for the procedure.

## 2018-02-23 ENCOUNTER — Ambulatory Visit (HOSPITAL_COMMUNITY)
Admission: RE | Admit: 2018-02-23 | Discharge: 2018-02-23 | Disposition: A | Discharge status: Home / Self Care | Payer: BC Managed Care – PPO | Source: Ambulatory Visit | Attending: Internal Medicine | Admitting: Internal Medicine

## 2018-02-23 ENCOUNTER — Encounter: Payer: Self-pay | Admitting: Internal Medicine

## 2018-02-23 DIAGNOSIS — K219 Gastro-esophageal reflux disease without esophagitis: Secondary | ICD-10-CM | POA: Diagnosis present

## 2018-02-23 DIAGNOSIS — K5900 Constipation, unspecified: Secondary | ICD-10-CM | POA: Insufficient documentation

## 2018-02-23 DIAGNOSIS — R1013 Epigastric pain: Secondary | ICD-10-CM | POA: Diagnosis present

## 2018-03-06 ENCOUNTER — Encounter: Payer: Self-pay | Admitting: Internal Medicine

## 2018-03-06 ENCOUNTER — Ambulatory Visit (AMBULATORY_SURGERY_CENTER): Payer: BC Managed Care – PPO | Admitting: Internal Medicine

## 2018-03-06 VITALS — BP 156/93 | HR 69 | Temp 95.9°F | Resp 12 | Ht 63.5 in | Wt 183.0 lb

## 2018-03-06 DIAGNOSIS — K219 Gastro-esophageal reflux disease without esophagitis: Secondary | ICD-10-CM

## 2018-03-06 DIAGNOSIS — K5900 Constipation, unspecified: Secondary | ICD-10-CM

## 2018-03-06 DIAGNOSIS — Z1211 Encounter for screening for malignant neoplasm of colon: Secondary | ICD-10-CM

## 2018-03-06 DIAGNOSIS — R1013 Epigastric pain: Secondary | ICD-10-CM

## 2018-03-06 MED ORDER — SODIUM CHLORIDE 0.9 % IV SOLN: 500.0000 mL | Freq: Once | INTRAVENOUS | Status: DC

## 2018-03-06 NOTE — Op Note (Signed)
[facility] Patient Name: Tara Dyer Procedure Date: 03/06/2018 2:16 PM MRN: [id number] Endoscopist: Wilhemina Bonito. Marina Goodell , MD Age: 58 Referring MD:  Date of Birth: [birth year] Gender: Female Account #: [id number] Procedure:                Colonoscopy Indications:              Screening for colorectal malignant neoplasm.                            Incidental constipation. Negative index exam 2010 Medicines:                Monitored Anesthesia Care Procedure:                Pre-Anesthesia Assessment:                           - Prior to the procedure, a History and Physical                            was performed, and patient medications and                            allergies were reviewed. The patient's tolerance of                            previous anesthesia was also reviewed. The risks                            and benefits of the procedure and the sedation                            options and risks were discussed with the patient.                            All questions were answered, and informed consent                            was obtained. Prior Anticoagulants: The patient has                            taken no previous anticoagulant or antiplatelet                            agents. ASA Grade Assessment: II - A patient with                            mild systemic disease. After reviewing the risks                            and benefits, the patient was deemed in                            satisfactory condition to undergo the procedure.  After obtaining informed consent, the colonoscope                            was passed under direct vision. Throughout the                            procedure, the patient's blood pressure, pulse, and                            oxygen saturations were monitored continuously. The                            Model PCF-[id number]) scope was introduced                            through the  anus and advanced to the the cecum,                            identified by appendiceal orifice and ileocecal                            valve. The ileocecal valve, appendiceal orifice,                            and rectum were photographed. The quality of the                            bowel preparation was excellent. The colonoscopy                            was performed without difficulty. The patient                            tolerated the procedure well. The bowel preparation                            used was SUPREP. Scope In: 2:22:05 PM Scope Out: 2:34:26 PM Scope Withdrawal Time: 0 hours 9 minutes 9 seconds  Total Procedure Duration: 0 hours 12 minutes 21 seconds  Findings:                 Multiple small-mouthed diverticula were found in                            the sigmoid colon and ascending colon.                           The exam was otherwise without abnormality on                            direct and retroflexion views. Complications:            No immediate complications. Estimated blood loss:  None. Estimated Blood Loss:     Estimated blood loss: none. Impression:               - Diverticulosis in the sigmoid colon and in the                            ascending colon.                           - The examination was otherwise normal on direct                            and retroflexion views.                           - No specimens collected. Recommendation:           - Repeat colonoscopy in 10 years for screening                            purposes.                           - Patient has a contact number available for                            emergencies. The signs and symptoms of potential                            delayed complications were discussed with the                            patient. Return to normal activities tomorrow.                            Written discharge instructions were provided to the                             patient.                           - Resume previous diet.                           - Continue present medications. Wilhemina BonitoJohn N. Marina GoodellPerry, MD 03/06/2018 2:40:15 PM This report has been signed electronically.

## 2018-03-06 NOTE — Op Note (Signed)
[facility] Patient Name: Tara Dyer Procedure Date: 03/06/2018 2:16 PM MRN: [id number] Endoscopist: Wilhemina Bonito. Marina Goodell , MD Age: 58 Referring MD:  Date of Birth: 02/18/1960 Gender: Female Account #: [id number] Procedure:                Upper GI endoscopy Indications:              Epigastric abdominal pain, Esophageal reflux Medicines:                Monitored Anesthesia Care Procedure:                Pre-Anesthesia Assessment:                           - Prior to the procedure, a History and Physical                            was performed, and patient medications and                            allergies were reviewed. The patient's tolerance of                            previous anesthesia was also reviewed. The risks                            and benefits of the procedure and the sedation                            options and risks were discussed with the patient.                            All questions were answered, and informed consent                            was obtained. Prior Anticoagulants: The patient has                            taken no previous anticoagulant or antiplatelet                            agents. ASA Grade Assessment: II - A patient with                            mild systemic disease. After reviewing the risks                            and benefits, the patient was deemed in                            satisfactory condition to undergo the procedure.                           After obtaining informed consent, the endoscope was  passed under direct vision. Throughout the                            procedure, the patient's blood pressure, pulse, and                            oxygen saturations were monitored continuously. The                            Model GIF-[id number](SN#[id number]) scope was introduced                            through the mouth, and advanced to the second part                            of  duodenum. The upper GI endoscopy was                            accomplished without difficulty. The patient                            tolerated the procedure well. Scope In: Scope Out: Findings:                 The esophagus was normal.                           The stomach was normal.                           The examined duodenum was normal.                           The cardia and gastric fundus were normal on                            retroflexion. Complications:            No immediate complications. Estimated Blood Loss:     Estimated blood loss: none. Impression:               1. GERD                           2. Normal EGD. Recommendation:           - Patient has a contact number available for                            emergencies. The signs and symptoms of potential                            delayed complications were discussed with the                            patient. Return to normal activities tomorrow.  Written discharge instructions were provided to the                            patient.                           - Resume previous diet.                           - Continue present medications including recently                            prescribed omeprazole daily.                           - Office follow-up with Dr. Marina Goodell in 3 months Wilhemina Bonito. Marina Goodell, MD 03/06/2018 2:47:04 PM This report has been signed electronically.

## 2018-03-06 NOTE — Patient Instructions (Signed)
YOU HAD AN ENDOSCOPIC PROCEDURE TODAY AT THE [facility]:   Refer to the procedure report that was given to you for any specific questions about what was found during the examination.  If the procedure report does not answer your questions, please call your gastroenterologist to clarify.  If you requested that your care partner not be given the details of your procedure findings, then the procedure report has been included in a sealed envelope for you to review at your convenience later.  YOU SHOULD EXPECT: Some feelings of bloating in the abdomen. Passage of more gas than usual.  Walking can help get rid of the air that was put into your GI tract during the procedure and reduce the bloating. If you had a lower endoscopy (such as a colonoscopy or flexible sigmoidoscopy) you may notice spotting of blood in your stool or on the toilet paper. If you underwent a bowel prep for your procedure, you may not have a normal bowel movement for a few days.  Please Note:  You might notice some irritation and congestion in your nose or some drainage.  This is from the oxygen used during your procedure.  There is no need for concern and it should clear up in a day or so.  SYMPTOMS TO REPORT IMMEDIATELY:   Following lower endoscopy (colonoscopy or flexible sigmoidoscopy):  Excessive amounts of blood in the stool  Significant tenderness or worsening of abdominal pains  Swelling of the abdomen that is new, acute  Fever of 100F or higher   Following upper endoscopy (EGD)  Vomiting of blood or coffee ground material  New chest pain or pain under the shoulder blades  Painful or persistently difficult swallowing  New shortness of breath  Fever of 100F or higher  Black, tarry-looking stools  For urgent or emergent issues, a gastroenterologist can be reached at any hour by calling [phone].   DIET:  We do recommend a small meal at first, but then you may proceed to your regular diet.  Drink  plenty of fluids but you should avoid alcoholic beverages for 24 hours.  ACTIVITY:  You should plan to take it easy for the rest of today and you should NOT DRIVE or use heavy machinery until tomorrow (because of the sedation medicines used during the test).    FOLLOW UP: Our staff will call the number listed on your records the next business day following your procedure to check on you and address any questions or concerns that you may have regarding the information given to you following your procedure. If we do not reach you, we will leave a message.  However, if you are feeling well and you are not experiencing any problems, there is no need to return our call.  We will assume that you have returned to your regular daily activities without incident.  If any biopsies were taken you will be contacted by phone or by letter within the next 1-3 weeks.  Please call us at [phone] if you have not heard about the biopsies in 3 weeks.    SIGNATURES/CONFIDENTIALITY: You and/or your care partner have signed paperwork which will be entered into your electronic medical record.  These signatures attest to the fact that that the information above on your After Visit Summary has been reviewed and is understood.  Full responsibility of the confidentiality of this discharge information lies with you and/or your care-partner.  GERD information given. Continue omeprazole daily. Follow up with Dr Marina Goodell  in 3 months for GERD.  Diverticulosis information given. Recall colonoscopy 10 years-2030

## 2018-03-06 NOTE — Progress Notes (Signed)
PT taken to PACU. Monitors in place. VSS. Report given to RN. 

## 2018-03-07 ENCOUNTER — Telehealth: Payer: Self-pay | Admitting: *Deleted

## 2018-03-07 NOTE — Telephone Encounter (Signed)
  Follow up Call-  Call back number 03/06/2018  Post procedure Call Back phone  # [phone]  Permission to leave phone message Yes  Some recent data might be hidden     Patient questions:  Do you have a fever, pain , or abdominal swelling? No. Pain Score  0 *  Have you tolerated food without any problems? Yes.    Have you been able to return to your normal activities? Yes.    Do you have any questions about your discharge instructions: Diet   No. Medications  No. Follow up visit  No.  Do you have questions or concerns about your Care? No.  Actions: * If pain score is 4 or above: No action needed, pain <4.

## 2018-05-24 ENCOUNTER — Encounter: Payer: Self-pay | Admitting: Internal Medicine

## 2018-05-31 ENCOUNTER — Other Ambulatory Visit: Payer: Self-pay | Admitting: Family Medicine

## 2018-05-31 DIAGNOSIS — R1031 Right lower quadrant pain: Secondary | ICD-10-CM

## 2018-05-31 DIAGNOSIS — R1084 Generalized abdominal pain: Secondary | ICD-10-CM

## 2018-05-31 DIAGNOSIS — G8929 Other chronic pain: Secondary | ICD-10-CM

## 2018-06-14 ENCOUNTER — Ambulatory Visit
Admission: RE | Admit: 2018-06-14 | Discharge: 2018-06-14 | Disposition: A | Discharge status: Home / Self Care | Payer: BC Managed Care – PPO | Source: Ambulatory Visit | Attending: Family Medicine | Admitting: Family Medicine

## 2018-06-14 ENCOUNTER — Other Ambulatory Visit: Payer: BC Managed Care – PPO

## 2018-06-14 DIAGNOSIS — R1084 Generalized abdominal pain: Secondary | ICD-10-CM

## 2018-06-14 DIAGNOSIS — R1031 Right lower quadrant pain: Secondary | ICD-10-CM

## 2018-06-14 DIAGNOSIS — G8929 Other chronic pain: Secondary | ICD-10-CM

## 2018-11-14 ENCOUNTER — Other Ambulatory Visit: Payer: Self-pay

## 2018-11-14 DIAGNOSIS — Z20822 Contact with and (suspected) exposure to covid-19: Secondary | ICD-10-CM

## 2018-11-16 LAB — NOVEL CORONAVIRUS, NAA: SARS-CoV-2, NAA: NOT DETECTED

## 2018-11-16 LAB — SPECIMEN STATUS REPORT

## 2019-03-17 ENCOUNTER — Ambulatory Visit: Payer: BC Managed Care – PPO

## 2019-03-27 ENCOUNTER — Other Ambulatory Visit: Payer: Self-pay | Admitting: Internal Medicine

## 2019-07-13 ENCOUNTER — Other Ambulatory Visit: Payer: Self-pay | Admitting: Internal Medicine

## 2019-07-19 ENCOUNTER — Other Ambulatory Visit: Payer: Self-pay | Admitting: Internal Medicine

## 2019-10-27 ENCOUNTER — Other Ambulatory Visit: Payer: Self-pay | Admitting: Internal Medicine

## 2019-11-04 ENCOUNTER — Other Ambulatory Visit: Payer: Self-pay | Admitting: Internal Medicine

## 2020-01-25 ENCOUNTER — Ambulatory Visit: Payer: BC Managed Care – PPO | Admitting: Internal Medicine

## 2020-10-08 IMAGING — US US PELVIS COMPLETE WITH TRANSVAGINAL
1 series · 14 of 21 positions shown · non-contrast
Comparison: None

CLINICAL DATA: Right lower quadrant pain



[Series 1: us pelvis complete with transvaginal · 0.22mm/px · 14 of 21 slices shown]
[im 1/21]
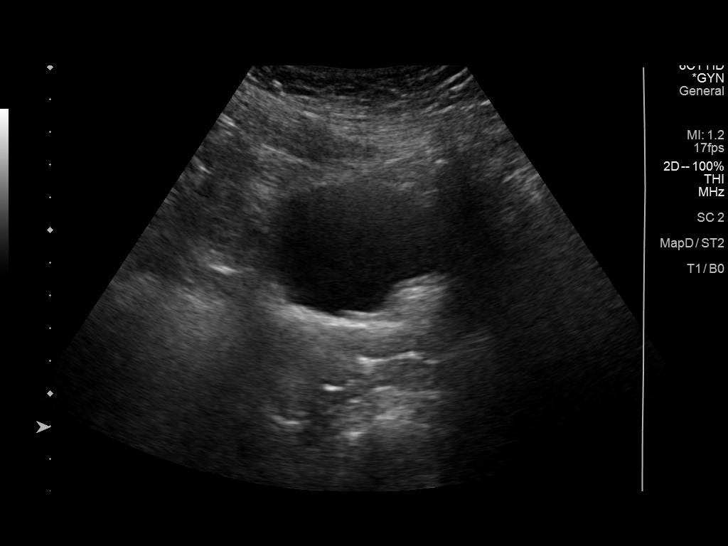
[im 3/21]
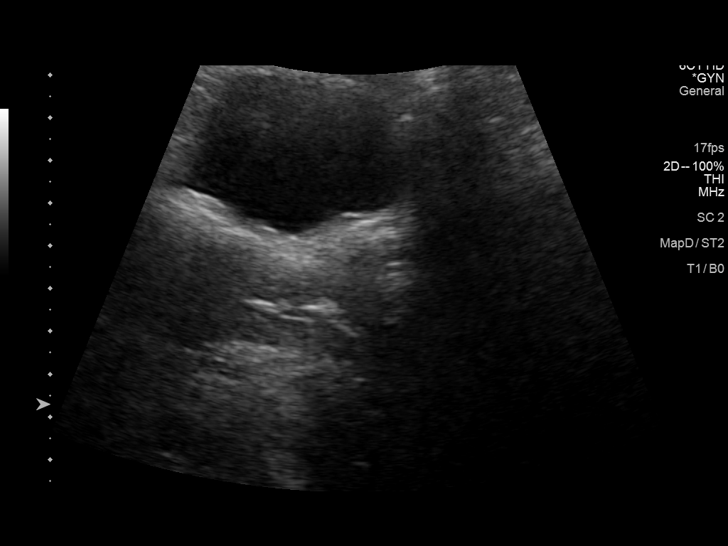
[im 4/21]
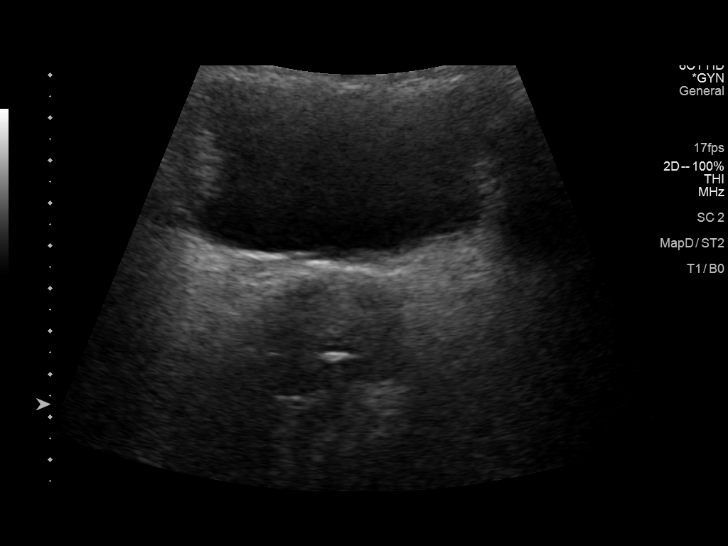
[im 6/21]
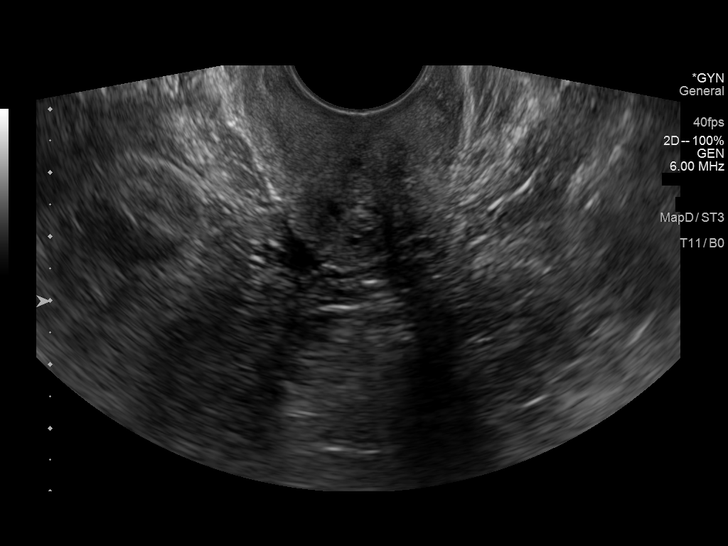
[im 7/21]
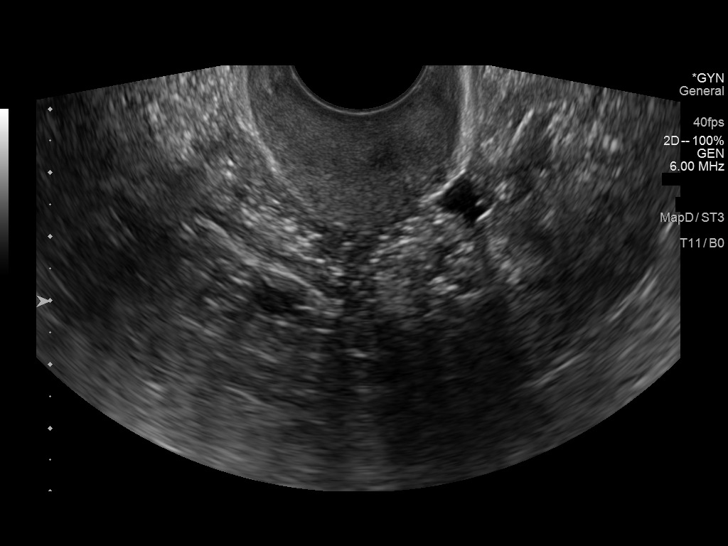
[im 9/21]
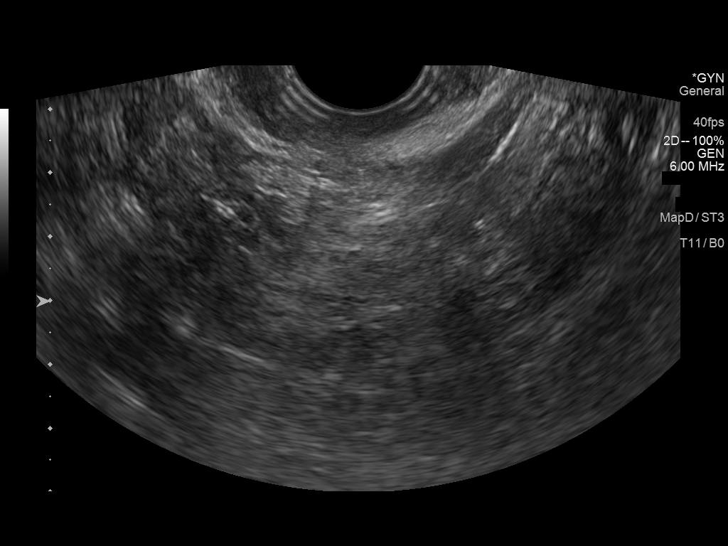
[im 10/21]
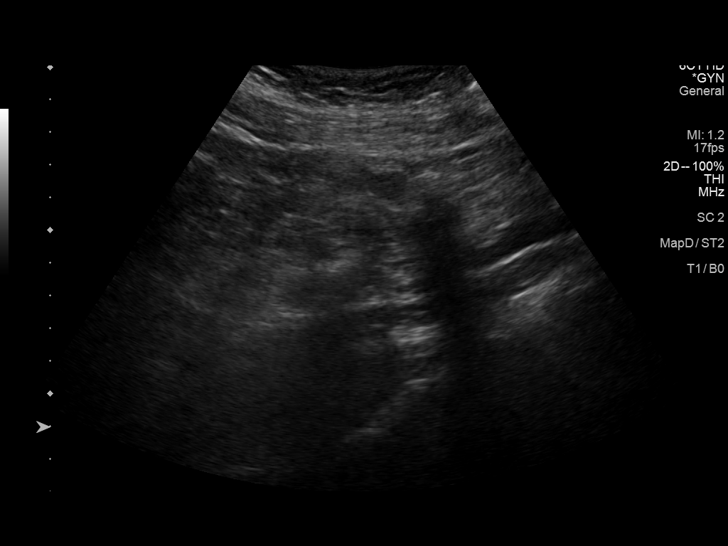
[im 12/21]
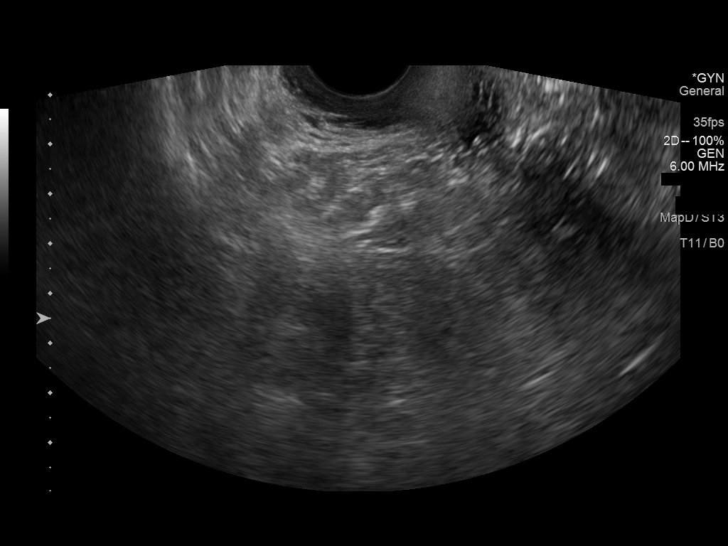
[im 13/21]
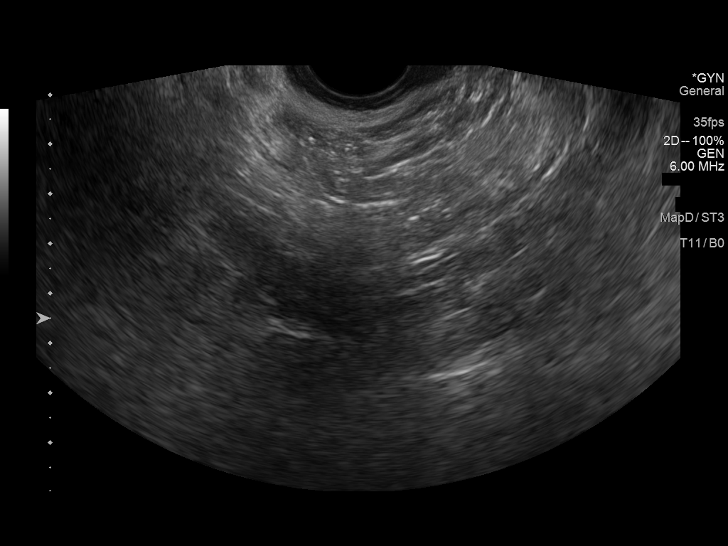
[im 15/21]
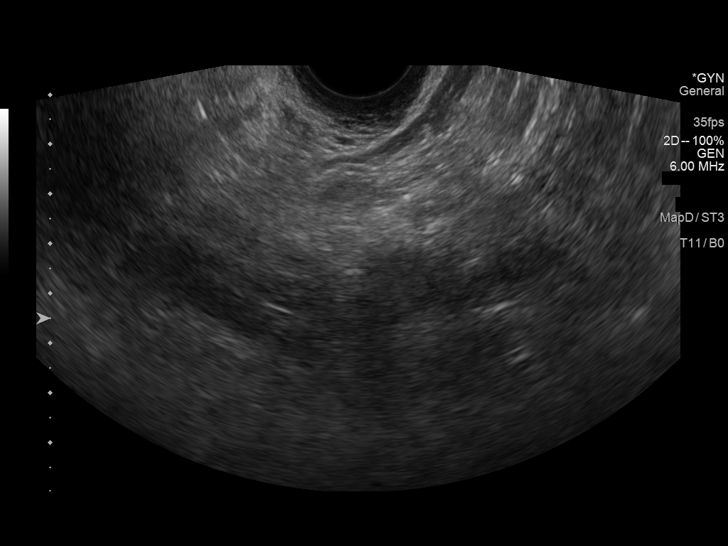
[im 16/21]
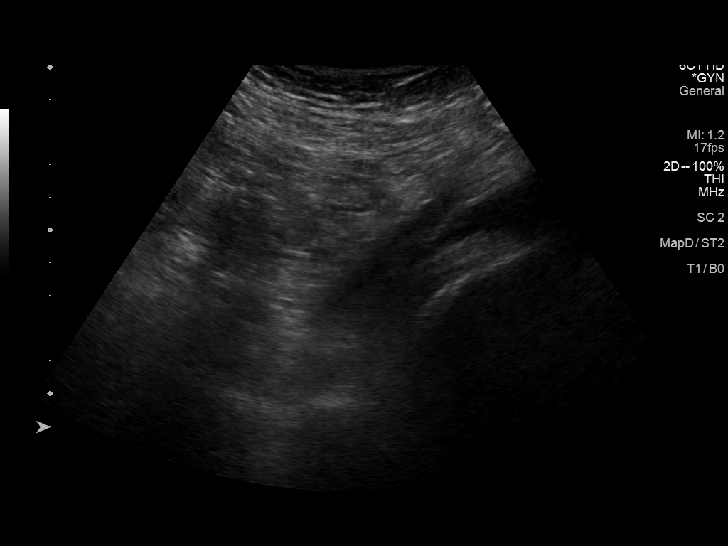
[im 18/21]
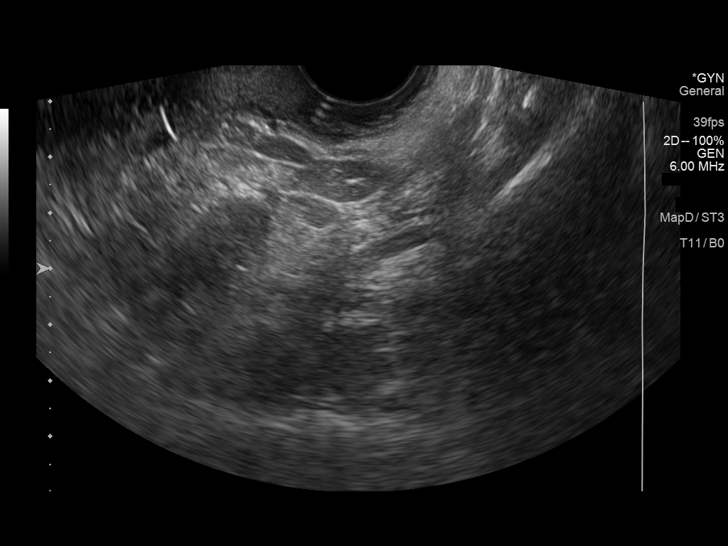
[im 19/21]
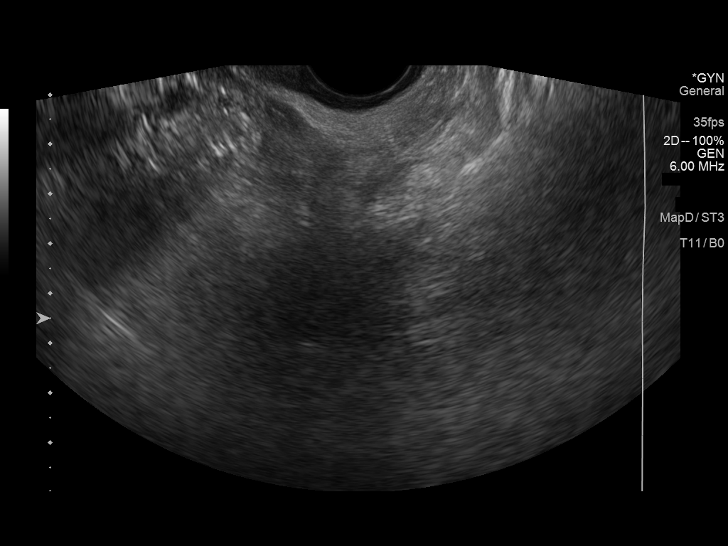
[im 21/21]
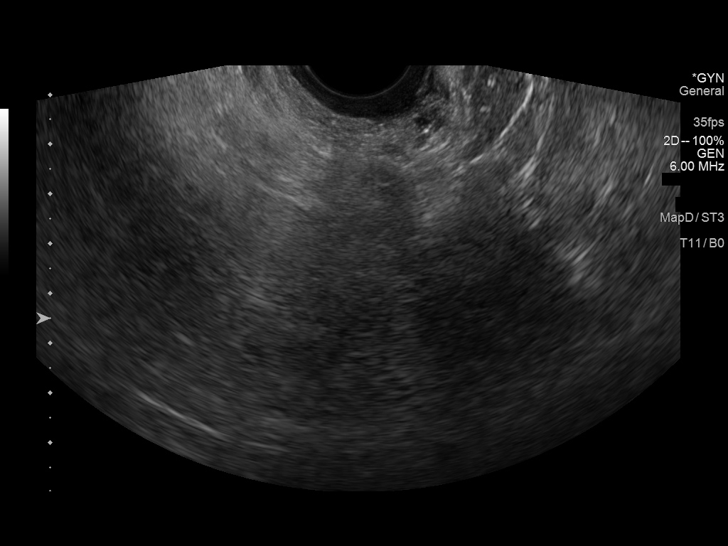

[14 of 21 positions shown; findings below may reference images not displayed]

FINDINGS: Uterus

Measurements: Prior hysterectomy.

Endometrium

Thickness: Prior hysterectomy.

Right ovary

Measurements: Not visualized.  No adnexal mass seen.

Left ovary

Measurements: Not visualized.  No adnexal mass seen.

Other findings

No abnormal free fluid.
IMPRESSION: Prior hysterectomy. Neither ovary visualized. No adnexal/pelvic mass
visualized.

## 2020-10-08 IMAGING — US ULTRASOUND ABDOMEN COMPLETE
1 series · 14 of 25 positions shown · non-contrast
Comparison: 02/23/2018

CLINICAL DATA: Right lower quadrant pain

EXAM:
ABDOMEN ULTRASOUND COMPLETE

[Series 1: ultrasound abdomen complete · 0.14mm/px · 14 of 94 slices shown]
[im 1/94]
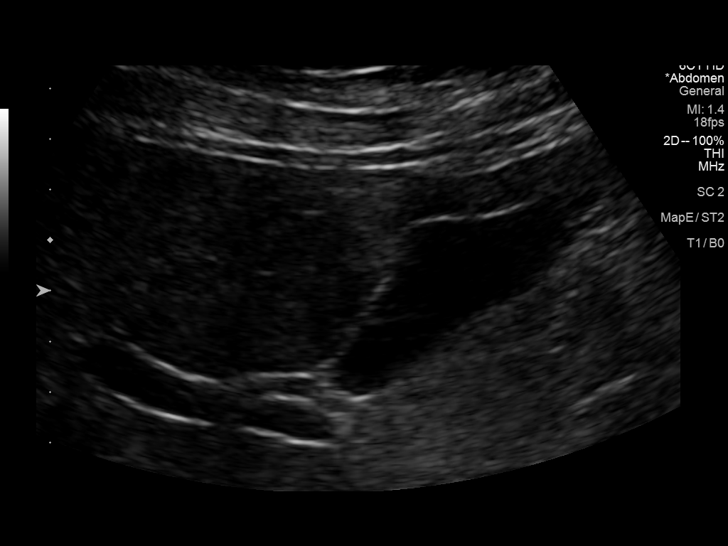
[im 8/94]
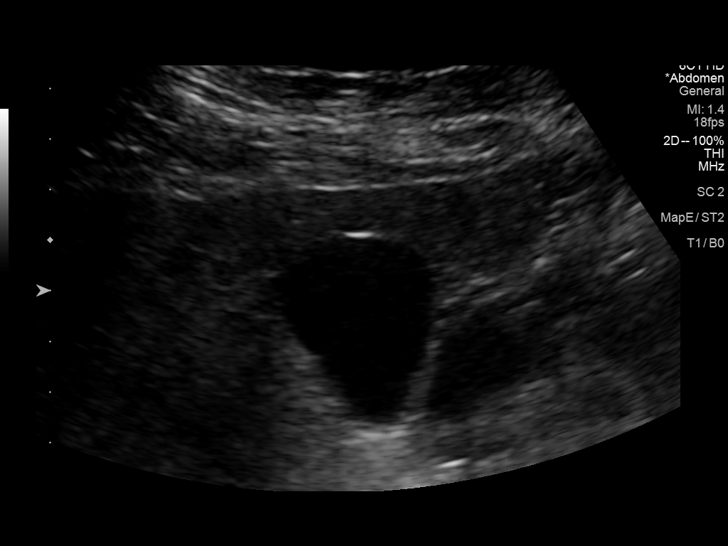
[im 16/94]
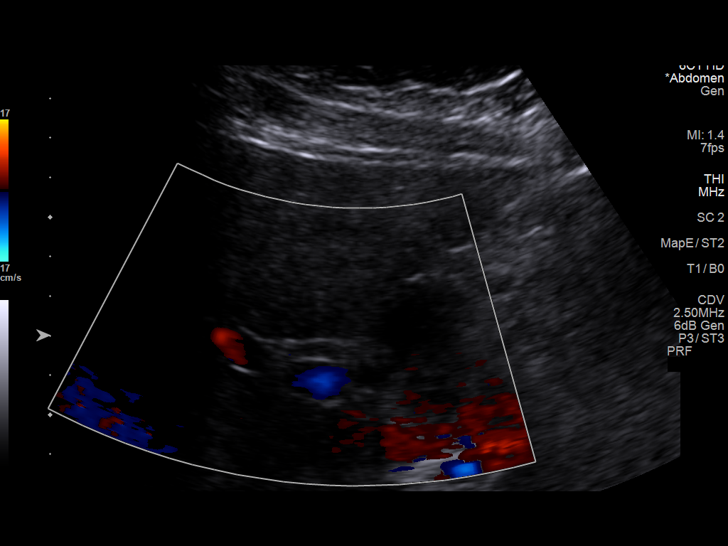
[im 24/94]
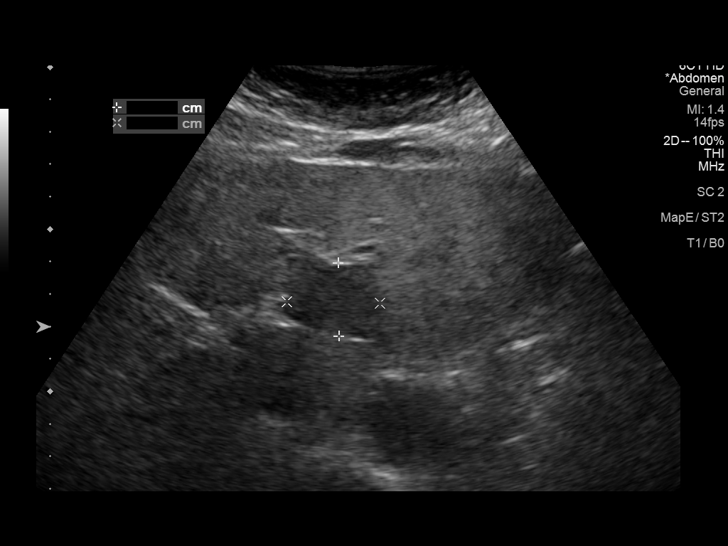
[im 32/94]
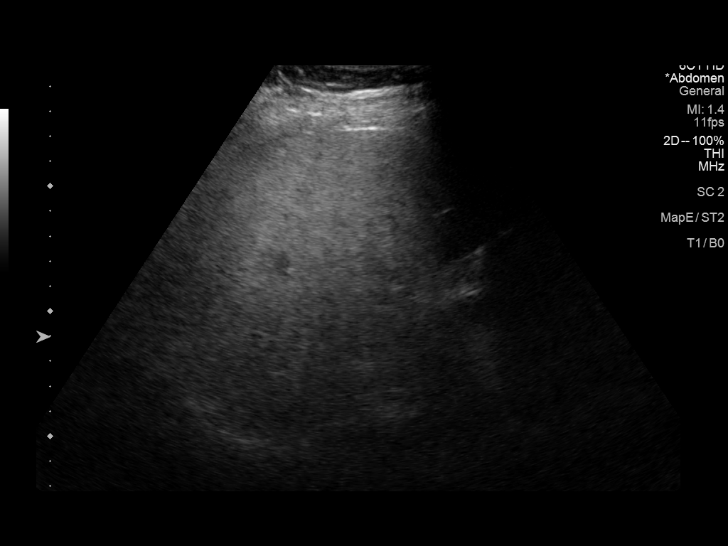
[im 35/94]
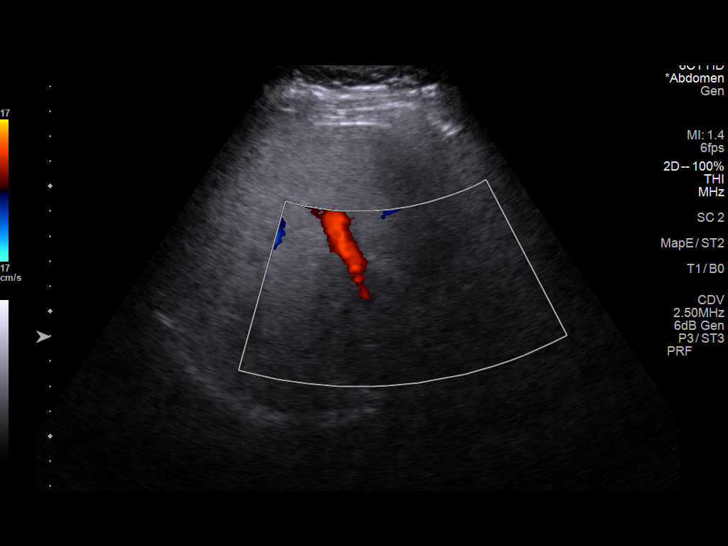
[im 43/94]
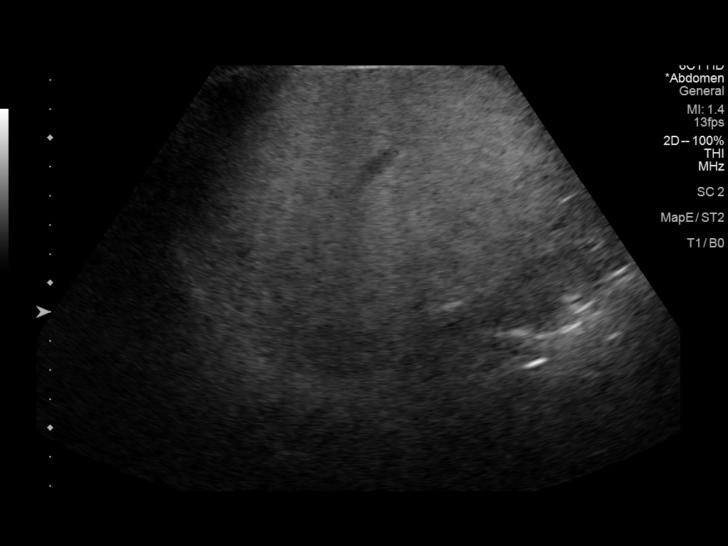
[im 51/94]
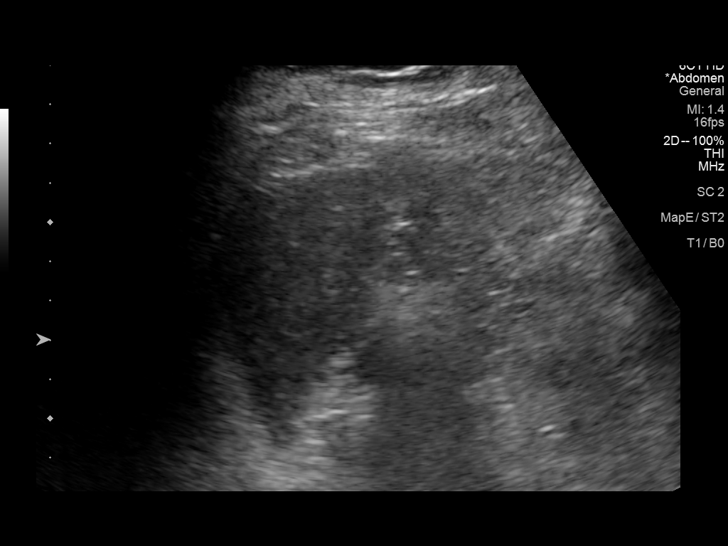
[im 59/94]
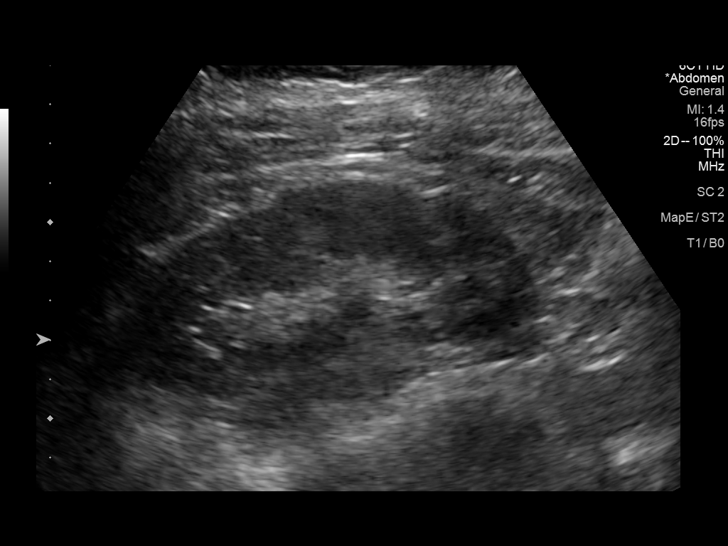
[im 63/94]
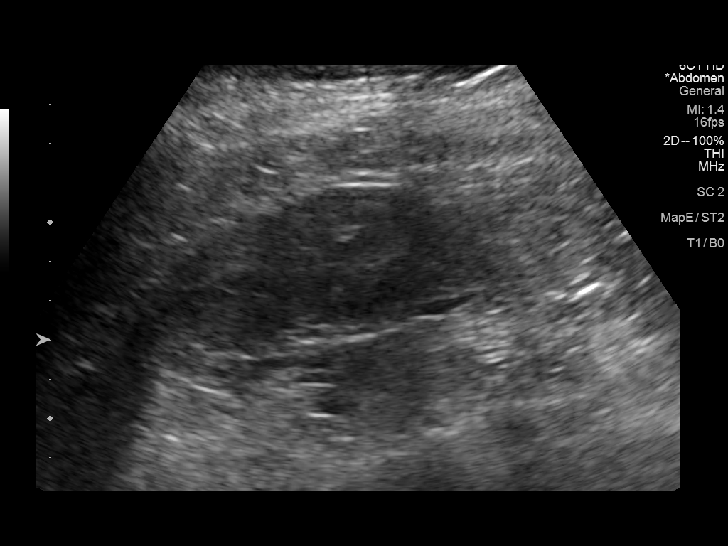
[im 70/94]
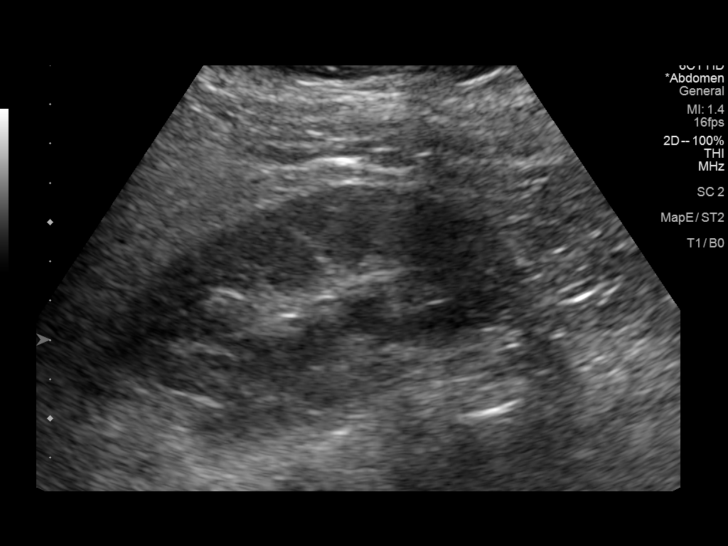
[im 78/94]
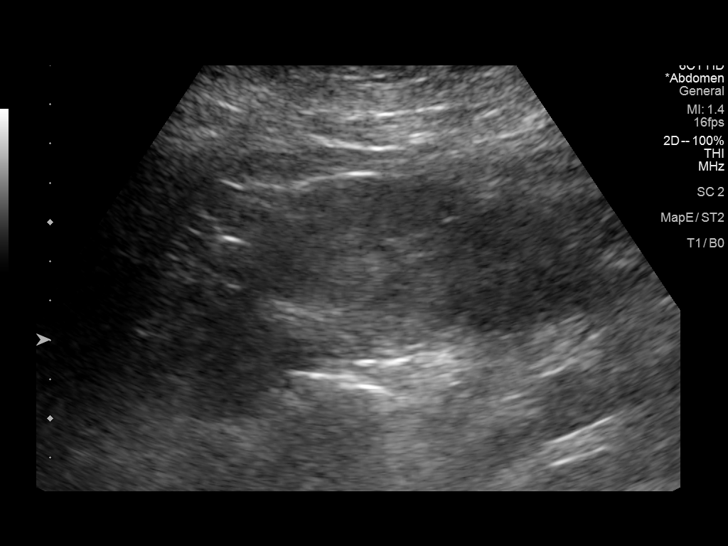
[im 86/94]
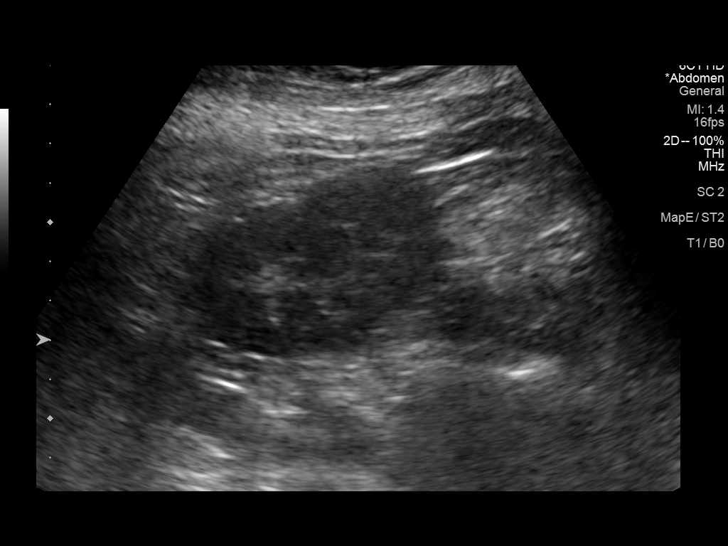
[im 94/94]
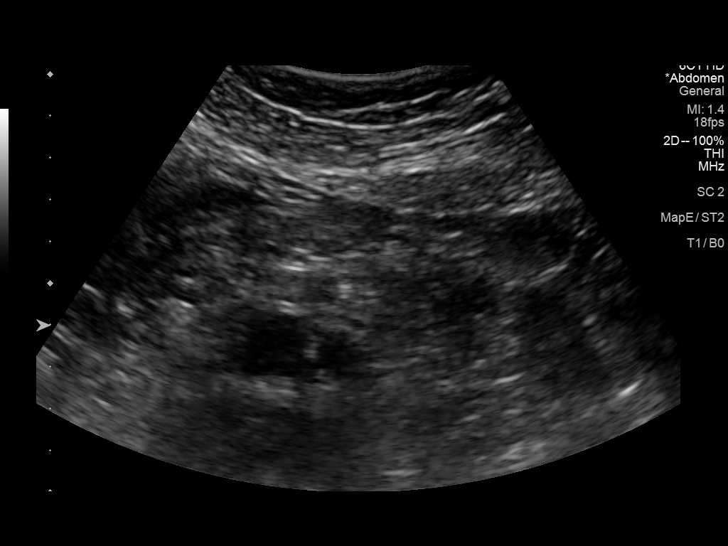

[14 of 25 positions shown; findings below may reference images not displayed]

FINDINGS: Gallbladder: No gallstones or wall thickening visualized. No
sonographic Murphy sign noted by sonographer.

Common bile duct: Diameter: Normal caliber, 4 mm

Liver: Increased echotexture compatible with fatty infiltration. No
focal abnormality or biliary ductal dilatation. Focal fatty sparing
noted in the left hepatic lobe. Portal vein is patent on color
Doppler imaging with normal direction of blood flow towards the
liver.

IVC: No abnormality visualized.

Pancreas: Visualized portion unremarkable.

Spleen: Size and appearance within normal limits.

Right Kidney: Length: 10.8 cm. Echogenicity within normal limits. No
mass or hydronephrosis visualized.

Left Kidney: Length: 11.3 cm. Echogenicity within normal limits. No
mass or hydronephrosis visualized.

Abdominal aorta: No aneurysm visualized.

Other findings: None.
IMPRESSION: Fatty infiltration of the liver.

No acute findings.

## 2020-11-22 IMAGING — US US ABDOMEN COMPLETE
1 series · 13 of 25 positions shown · non-contrast
Comparison: None.

CLINICAL DATA: Epigastric region pain

EXAM:
ABDOMEN ULTRASOUND COMPLETE

[Series 1: us abdomen complete · 0.22mm/px · 13 of 107 slices shown]
[im 1/107]
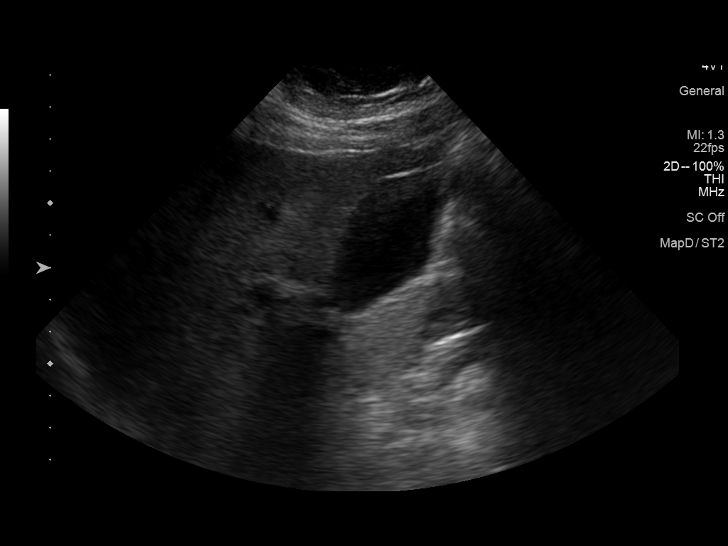
[im 9/107]
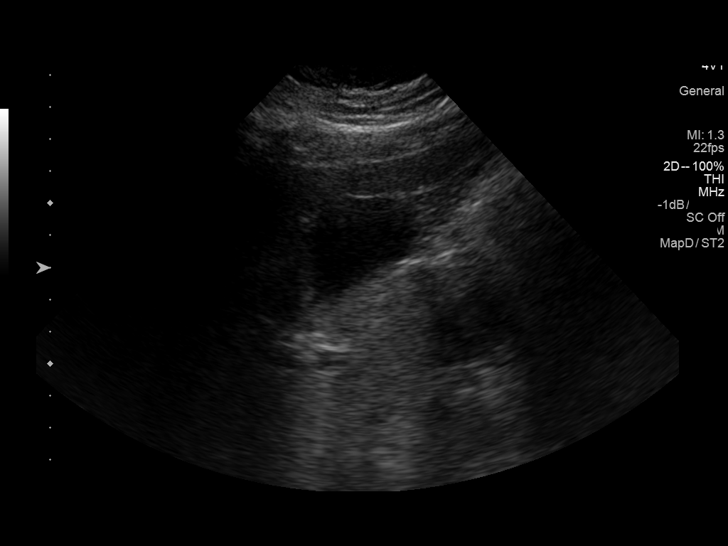
[im 18/107]
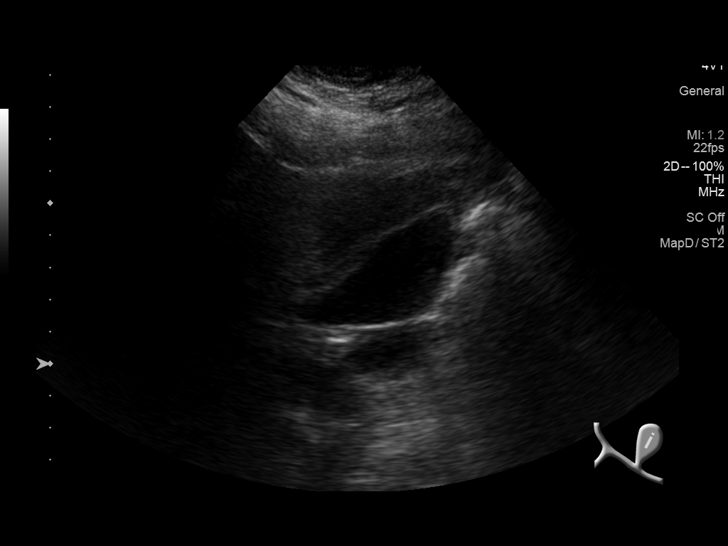
[im 27/107]
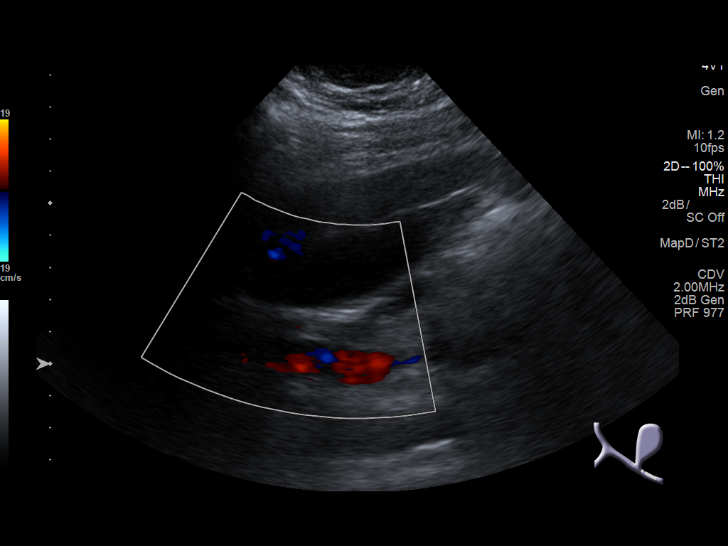
[im 36/107]
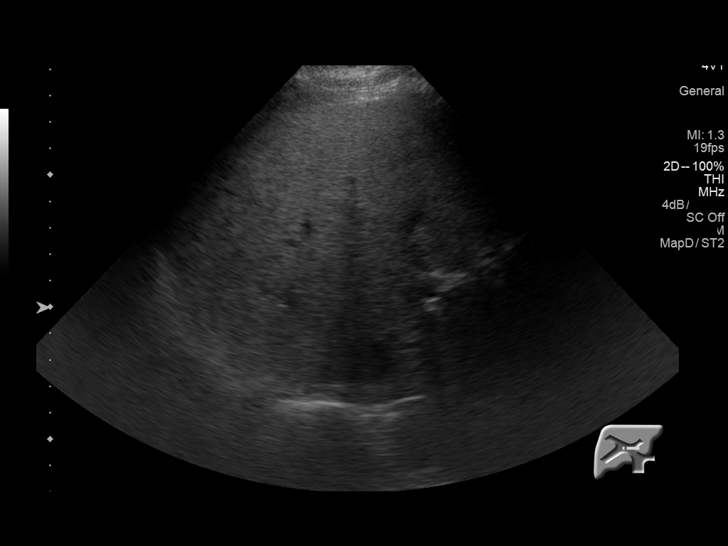
[im 45/107]
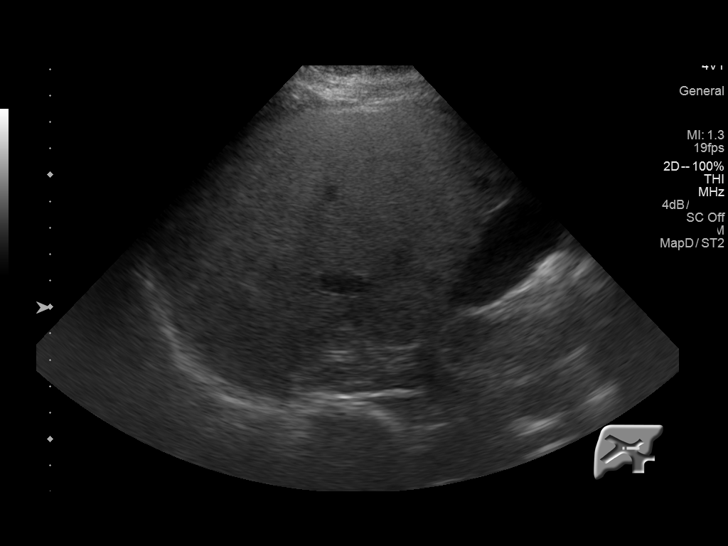
[im 54/107]
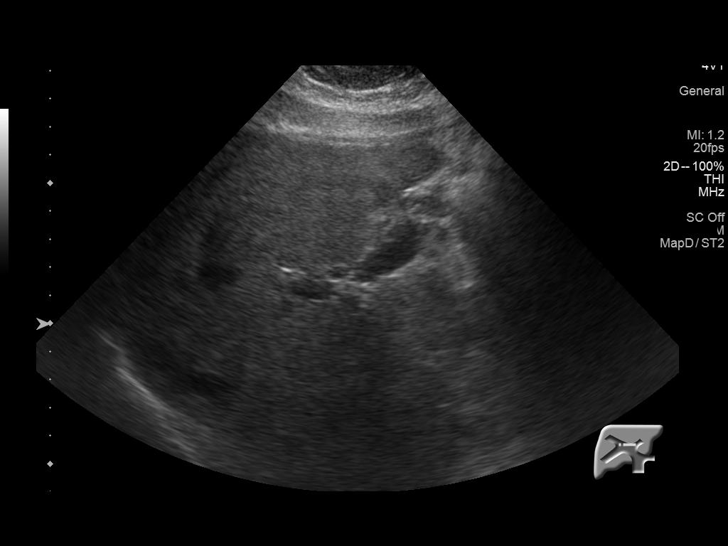
[im 62/107]
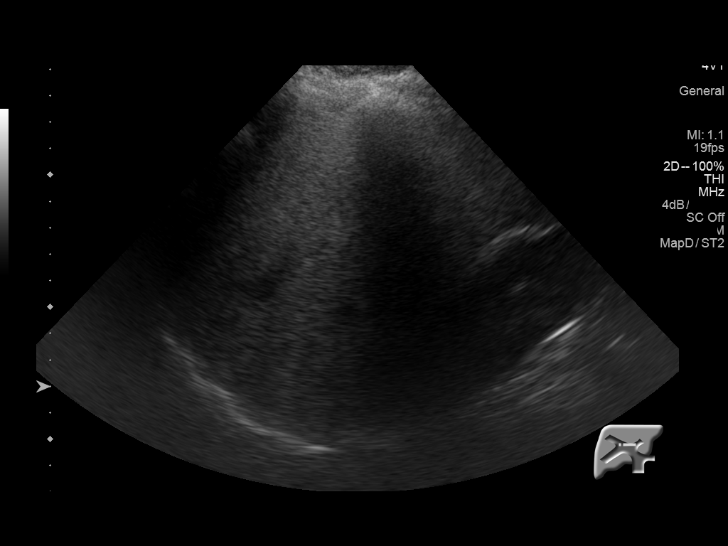
[im 71/107]
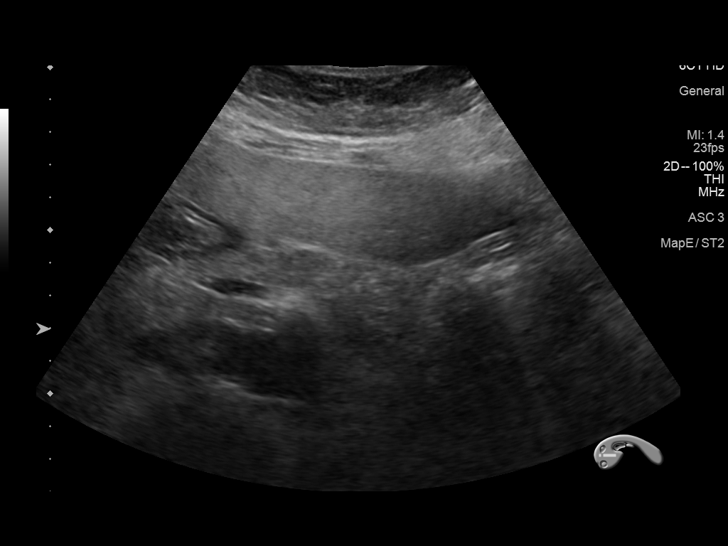
[im 80/107]
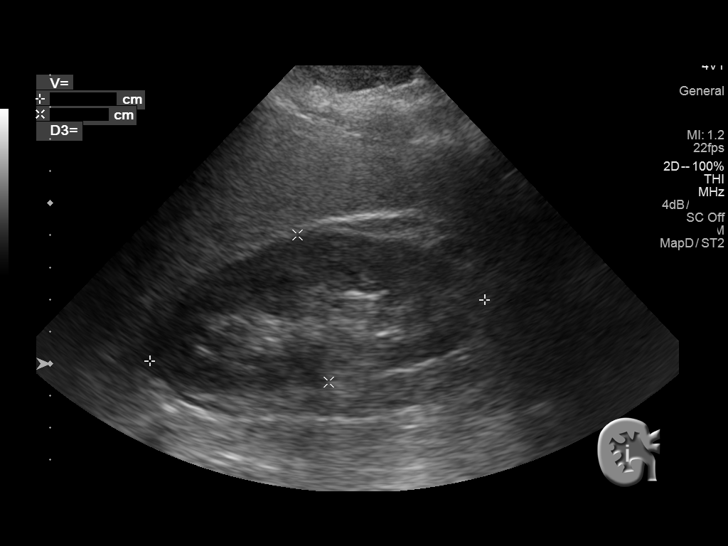
[im 89/107]
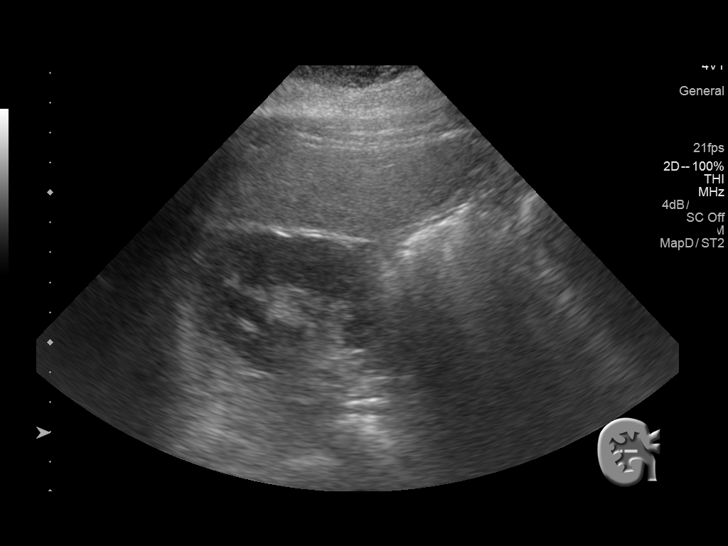
[im 98/107]
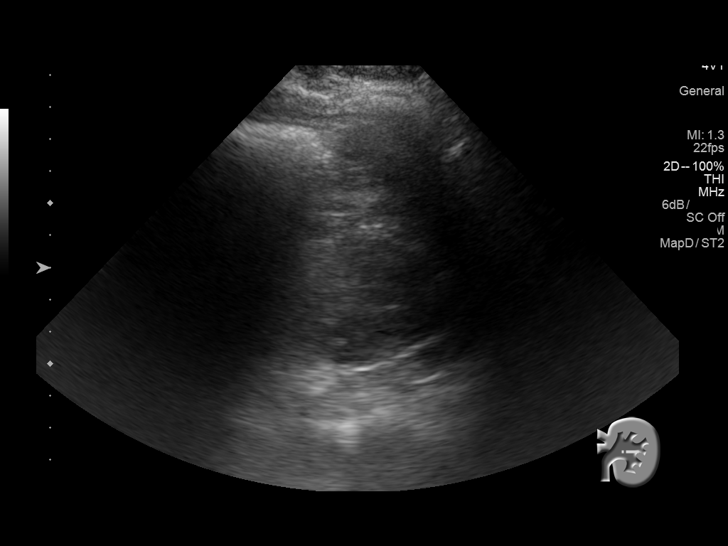
[im 107/107]
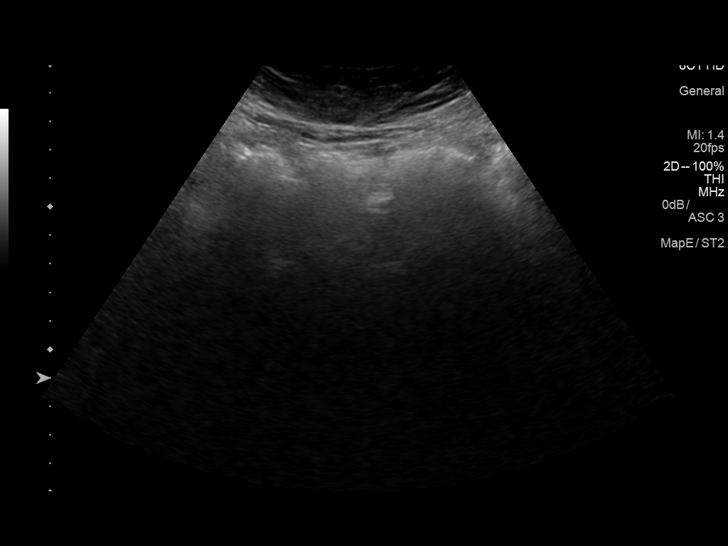

[13 of 25 positions shown; findings below may reference images not displayed]

FINDINGS: Gallbladder: No gallstones or wall thickening visualized. There is
no pericholecystic fluid. No sonographic Murphy sign noted by
sonographer.

Common bile duct: Diameter: 5 mm. No intrahepatic, common hepatic,
or common bile duct dilatation.

Liver: No focal lesion identified. Liver echogenicity is increased
diffusely. Portal vein is patent on color Doppler imaging with
normal direction of blood flow towards the liver.

IVC: No abnormality visualized.

Pancreas: No pancreatic mass or inflammatory focus.

Spleen: Size and appearance within normal limits.

Right Kidney: Length: 10.6 cm. Echogenicity within normal limits. No
mass or hydronephrosis visualized.

Left Kidney: Length: 11.9 cm. Echogenicity within normal limits. No
mass or hydronephrosis visualized.

Abdominal aorta: No aneurysm visualized.

Other findings: No demonstrable ascites.
IMPRESSION: Diffuse increase in liver echogenicity, a finding indicative of
hepatic steatosis. While no focal liver lesions are evident on this
study, it must be cautioned that sensitivity of ultrasound for
detection of focal lesions is diminished in this circumstance.

Study otherwise unremarkable.

## 2021-09-23 ENCOUNTER — Ambulatory Visit
Admission: EM | Admit: 2021-09-23 | Discharge: 2021-09-23 | Disposition: A | Discharge status: Home / Self Care | Payer: BC Managed Care – PPO

## 2021-09-23 DIAGNOSIS — S80822A Blister (nonthermal), left lower leg, initial encounter: Secondary | ICD-10-CM | POA: Diagnosis not present

## 2021-09-23 NOTE — ED Triage Notes (Signed)
Pt c/o rash starting on Monday to her posterior knee area. States she was wearing capri pants and felt it was tight against the skin area. She fell asleep and when she woke up the area hurt. Shortly afterwards a blister appeared. Pt opened one blister and used alcohol to help with rash but it did not.

## 2021-09-23 NOTE — ED Provider Notes (Signed)
[facility]    CSN: [id number] Arrival date & time: 09/23/21  1723      History   Chief Complaint Chief Complaint  Patient presents with   Rash    HPI Tara Dyer is a 61 y.o. female.   Patient here today for evaluation of blisters to her posterior left leg just distal to the knee that have been present for a week.  She reports that she wore capris and felt they might of been too tight the area of blisters.  She also notes that she had applied alcohol and ice and is not sure if that combination created blisters as well.  She reports some mild pain and mild itching associated with blisters but no other symptoms.  She denies any blisters elsewhere.  Has not had fever.  She denies any shortness of breath.  The history is provided by the patient.    Past Medical History:  Diagnosis Date   Allergic rhinitis    Arthritis    Asthma    Diabetes mellitus without complication (HCC)    Diverticulosis    Gastroparesis    GERD (gastroesophageal reflux disease)    HLD (hyperlipidemia)    Hypertension    Lymphocytosis    Migraines     There are no problems to display for this patient.   Past Surgical History:  Procedure Laterality Date   ABDOMINAL HYSTERECTOMY  2006   KNEE SURGERY Right 2000    OB History   No obstetric history on file.      Home Medications    Prior to Admission medications   Medication Sig Start Date End Date Taking? Authorizing Provider  albuterol (PROVENTIL HFA;VENTOLIN HFA) 108 (90 BASE) MCG/ACT inhaler Inhale 2 puffs into the lungs every 6 (six) hours as needed for wheezing or shortness of breath.    [provider]  CINNAMON PO Take by mouth.    [provider]  hydrochlorothiazide (HYDRODIURIL) 12.5 MG tablet Take 1 tablet by mouth daily. 01/03/18   [provider]  hyoscyamine (LEVSIN SL) 0.125 MG SL tablet Place 1 tablet (0.125 mg total) under the tongue every 4 (four) hours as needed. Patient not  taking: Reported on 03/06/2018 10/14/14   Hilarie Fredrickson, MD  losartan (COZAAR) 100 MG tablet Take 1 tablet by mouth daily. 01/03/18   [provider]  omeprazole (PRILOSEC) 40 MG capsule Take 1 capsule (40 mg total) by mouth daily. Office visit for further refills 07/19/19   Hilarie Fredrickson, MD  TROKENDI XR 50 MG CP24 Take 1 capsule by mouth daily. 01/26/18   [provider]    Family History Family History  Problem Relation Age of Onset   Diabetes Mother    Ovarian cancer Mother    Diabetes Sister    Diabetes Brother    Breast cancer Niece    Irritable bowel syndrome Sister    Colon cancer Neg Hx    Esophageal cancer Neg Hx    Stomach cancer Neg Hx    Rectal cancer Neg Hx     Social History Social History   Tobacco Use   Smoking status: Never   Smokeless tobacco: Never  Substance Use Topics   Alcohol use: No   Drug use: Never     Allergies   Ace inhibitors and Erythromycin   Review of Systems Review of Systems  Constitutional:  Negative for chills and fever.  Eyes:  Negative for discharge and redness.  Respiratory:  Negative for  shortness of breath.   Gastrointestinal:  Negative for nausea and vomiting.  Skin:  Positive for color change.     Physical Exam Triage Vital Signs ED Triage Vitals  Enc Vitals Group     BP      Pulse      Resp      Temp      Temp src      SpO2      Weight      Height      Head Circumference      Peak Flow      Pain Score      Pain Loc      Pain Edu?      Excl. in GC?    No data found.  Updated Vital Signs BP (!) 171/83 (BP Location: Left Arm)   Pulse 73   Temp 97.8 F (36.6 C) (Oral)   Resp 14   SpO2 97%      Physical Exam Vitals and nursing note reviewed.  Constitutional:      General: She is not in acute distress.    Appearance: Normal appearance. She is not ill-appearing.  HENT:     Head: Normocephalic and atraumatic.  Eyes:     Conjunctiva/sclera: Conjunctivae normal.  Cardiovascular:      Rate and Rhythm: Normal rate.  Pulmonary:     Effort: Pulmonary effort is normal.  Skin:    General: Skin is warm and dry.     Comments: Multiple blisters/bulla noted to posterior left proximal lower leg just distal to the knee with mild associated erythema  Neurological:     Mental Status: She is alert.  Psychiatric:        Mood and Affect: Mood normal.        Behavior: Behavior normal.        Thought Content: Thought content normal.      UC Treatments / Results  Labs (all labs ordered are listed, but only abnormal results are displayed) Labs Reviewed - No data to display  EKG   Radiology No results found.  Procedures Procedures (including critical care time)  Medications Ordered in UC Medications - No data to display  Initial Impression / Assessment and Plan / UC Course  I have reviewed the triage vital signs and the nursing notes.  Pertinent labs & imaging results that were available during my care of the patient were reviewed by me and considered in my medical decision making (see chart for details).    No apparent secondary infection noted at this time.  Recommended patient keep area covered and avoid opening any blisters.  Encouraged follow-up with any signs of infection, increased erythema or further spread.  Patient expresses understanding.  Final Clinical Impressions(s) / UC Diagnoses   Final diagnoses:  Friction blisters of leg, left, initial encounter   Discharge Instructions   None    ED Prescriptions   None    PDMP not reviewed this encounter.   Tomi Bamberger, PA-C 09/23/21 1850

## 2021-12-28 ENCOUNTER — Other Ambulatory Visit (HOSPITAL_COMMUNITY): Payer: Self-pay | Admitting: Registered Nurse

## 2021-12-28 DIAGNOSIS — E785 Hyperlipidemia, unspecified: Secondary | ICD-10-CM

## 2022-01-14 ENCOUNTER — Ambulatory Visit (HOSPITAL_COMMUNITY)
Admission: RE | Admit: 2022-01-14 | Discharge: 2022-01-14 | Disposition: A | Discharge status: Home / Self Care | Payer: BC Managed Care – PPO | Source: Ambulatory Visit | Attending: Registered Nurse | Admitting: Registered Nurse

## 2022-01-14 DIAGNOSIS — E785 Hyperlipidemia, unspecified: Secondary | ICD-10-CM | POA: Insufficient documentation

## 2022-03-13 ENCOUNTER — Other Ambulatory Visit: Payer: Self-pay

## 2022-03-13 ENCOUNTER — Encounter (HOSPITAL_BASED_OUTPATIENT_CLINIC_OR_DEPARTMENT_OTHER): Payer: Self-pay | Admitting: Emergency Medicine

## 2022-03-13 DIAGNOSIS — K529 Noninfective gastroenteritis and colitis, unspecified: Secondary | ICD-10-CM | POA: Insufficient documentation

## 2022-03-13 DIAGNOSIS — Z20822 Contact with and (suspected) exposure to covid-19: Secondary | ICD-10-CM | POA: Insufficient documentation

## 2022-03-13 DIAGNOSIS — E876 Hypokalemia: Secondary | ICD-10-CM | POA: Insufficient documentation

## 2022-03-13 DIAGNOSIS — R1084 Generalized abdominal pain: Secondary | ICD-10-CM | POA: Diagnosis present

## 2022-03-13 LAB — CBC
HCT: 35.7 % — ABNORMAL LOW (ref 36.0–46.0)
Hemoglobin: 12.3 g/dL (ref 12.0–15.0)
MCH: 31.1 pg (ref 26.0–34.0)
MCHC: 34.5 g/dL (ref 30.0–36.0)
MCV: 90.2 fL (ref 80.0–100.0)
Platelets: 265 10*3/uL (ref 150–400)
RBC: 3.96 MIL/uL (ref 3.87–5.11)
RDW: 13.9 % (ref 11.5–15.5)
WBC: 6.8 10*3/uL (ref 4.0–10.5)
nRBC: 0 % (ref 0.0–0.2)

## 2022-03-13 LAB — COMPREHENSIVE METABOLIC PANEL
ALT: 25 U/L (ref 0–44)
AST: 17 U/L (ref 15–41)
Albumin: 3.7 g/dL (ref 3.5–5.0)
Alkaline Phosphatase: 59 U/L (ref 38–126)
Anion gap: 6 (ref 5–15)
BUN: 22 mg/dL (ref 8–23)
CO2: 20 mmol/L — ABNORMAL LOW (ref 22–32)
Calcium: 8 mg/dL — ABNORMAL LOW (ref 8.9–10.3)
Chloride: 108 mmol/L (ref 98–111)
Creatinine, Ser: 0.69 mg/dL (ref 0.44–1.00)
GFR, Estimated: >60 mL/min (ref >60)
Glucose, Bld: 120 mg/dL — ABNORMAL HIGH (ref 70–99)
Potassium: 3.4 mmol/L — ABNORMAL LOW (ref 3.5–5.1)
Sodium: 134 mmol/L — ABNORMAL LOW (ref 135–145)
Total Bilirubin: 0.9 mg/dL (ref 0.3–1.2)
Total Protein: 7.2 g/dL (ref 6.5–8.1)

## 2022-03-13 LAB — URINALYSIS, MICROSCOPIC (REFLEX)

## 2022-03-13 LAB — URINALYSIS, ROUTINE W REFLEX MICROSCOPIC
Glucose, UA: NEGATIVE mg/dL
Hgb urine dipstick: NEGATIVE
Ketones, ur: 80 mg/dL — AB
Leukocytes,Ua: NEGATIVE
Nitrite: NEGATIVE
Protein, ur: 30 mg/dL — AB
Specific Gravity, Urine: 1.025 (ref 1.005–1.030)
pH: 5.5 (ref 5.0–8.0)

## 2022-03-13 LAB — LIPASE, BLOOD: Lipase: 23 U/L (ref 11–51)

## 2022-03-13 NOTE — ED Triage Notes (Signed)
Patient here with abdominal pain, nausea and vomiting that started yesterday after she got home from work.  Patient states she has not been able to keep food or liquid down.

## 2022-03-14 ENCOUNTER — Emergency Department (HOSPITAL_BASED_OUTPATIENT_CLINIC_OR_DEPARTMENT_OTHER)
Admission: EM | Admit: 2022-03-14 | Discharge: 2022-03-14 | Disposition: A | Discharge status: Home / Self Care | Payer: BC Managed Care – PPO | Attending: Emergency Medicine | Admitting: Emergency Medicine

## 2022-03-14 ENCOUNTER — Emergency Department (HOSPITAL_BASED_OUTPATIENT_CLINIC_OR_DEPARTMENT_OTHER): Payer: BC Managed Care – PPO

## 2022-03-14 DIAGNOSIS — K529 Noninfective gastroenteritis and colitis, unspecified: Secondary | ICD-10-CM

## 2022-03-14 LAB — RESP PANEL BY RT-PCR (RSV, FLU A&B, COVID)  RVPGX2
Influenza A by PCR: NEGATIVE
Influenza B by PCR: NEGATIVE
Resp Syncytial Virus by PCR: NEGATIVE
SARS Coronavirus 2 by RT PCR: NEGATIVE

## 2022-03-14 MED ORDER — ONDANSETRON 4 MG PO TBDP
8.0000 mg | ORAL_TABLET | Freq: Once | ORAL | Status: AC
  Administered 2022-03-14: 8 mg via ORAL
  Filled 2022-03-14: qty 2

## 2022-03-14 MED ORDER — DICYCLOMINE HCL 20 MG PO TABS: 20.0000 mg | ORAL_TABLET | Freq: Two times a day (BID) | ORAL | 0 refills | Status: AC

## 2022-03-14 MED ORDER — ONDANSETRON 4 MG PO TBDP: ORAL_TABLET | ORAL | 0 refills | Status: AC

## 2022-03-14 MED ORDER — DICYCLOMINE HCL 10 MG/ML IM SOLN
20.0000 mg | Freq: Once | INTRAMUSCULAR | Status: AC
  Administered 2022-03-14: 20 mg via INTRAMUSCULAR
  Filled 2022-03-14: qty 2

## 2022-03-14 NOTE — ED Provider Notes (Signed)
[facility] Provider Note   CSN: [id number] Arrival date & time: 03/13/22  2220     History  Chief Complaint  Patient presents with   Abdominal Pain    Tara Dyer is a 62 y.o. female.  The history is provided by the patient and the spouse.  Abdominal Pain Pain location:  Generalized Pain quality: bloating and cramping   Pain radiates to:  Does not radiate Pain severity:  Moderate Onset quality:  Gradual Duration:  1 day Timing:  Constant Progression:  Unchanged Chronicity:  New Context: not awakening from sleep   Relieved by:  Nothing Worsened by:  Nothing Ineffective treatments:  None tried Associated symptoms: constipation, diarrhea, nausea and vomiting   Associated symptoms: no anorexia, no belching, no chest pain, no chills and no shortness of breath   Risk factors: no alcohol abuse        Home Medications Prior to Admission medications   Medication Sig Start Date End Date Taking? Authorizing Provider  dicyclomine (BENTYL) 20 MG tablet Take 1 tablet (20 mg total) by mouth 2 (two) times daily. 03/14/22  Yes Ebba Goll, MD  ondansetron (ZOFRAN-ODT) 4 MG disintegrating tablet '4mg'$  ODT q8 hours prn nausea/vomit 03/14/22  Yes Edgel Degnan, MD  albuterol (PROVENTIL HFA;VENTOLIN HFA) 108 (90 BASE) MCG/ACT inhaler Inhale 2 puffs into the lungs every 6 (six) hours as needed for wheezing or shortness of breath.    [provider]  CINNAMON PO Take by mouth.    [provider]  hydrochlorothiazide (HYDRODIURIL) 12.5 MG tablet Take 1 tablet by mouth daily. 01/03/18   [provider]  hyoscyamine (LEVSIN SL) 0.125 MG SL tablet Place 1 tablet (0.125 mg total) under the tongue every 4 (four) hours as needed. Patient not taking: Reported on 03/06/2018 10/14/14   Irene Shipper, MD  losartan (COZAAR) 100 MG tablet Take 1 tablet by mouth daily. 01/03/18   [provider]  omeprazole (PRILOSEC)  40 MG capsule Take 1 capsule (40 mg total) by mouth daily. Office visit for further refills 07/19/19   Irene Shipper, MD  TROKENDI XR 50 MG CP24 Take 1 capsule by mouth daily. 01/26/18   [provider]      Allergies    Ace inhibitors and Erythromycin    Review of Systems   Review of Systems  Constitutional:  Negative for chills.  HENT:  Negative for drooling.   Eyes:  Negative for redness.  Respiratory:  Negative for shortness of breath, wheezing and stridor.   Cardiovascular:  Negative for chest pain.  Gastrointestinal:  Positive for abdominal pain, constipation, diarrhea, nausea and vomiting. Negative for anorexia.  All other systems reviewed and are negative.   Physical Exam Updated Vital Signs BP (!) 178/84 (BP Location: Right Arm)   Pulse 80   Temp 99.6 F (37.6 C) (Oral)   Resp 20   SpO2 99%  Physical Exam Vitals and nursing note reviewed.  Constitutional:      General: She is not in acute distress.    Appearance: Normal appearance. She is well-developed.  HENT:     Head: Normocephalic and atraumatic.     Nose: Nose normal.     Mouth/Throat:     Mouth: Mucous membranes are moist.     Pharynx: Oropharynx is clear.  Eyes:     Pupils: Pupils are equal, round, and reactive to light.  Cardiovascular:     Rate and Rhythm: Normal rate and regular  rhythm.     Pulses: Normal pulses.     Heart sounds: Normal heart sounds.  Pulmonary:     Effort: Pulmonary effort is normal. No respiratory distress.     Breath sounds: Normal breath sounds.  Abdominal:     General: Bowel sounds are normal. There is no distension.     Palpations: Abdomen is soft.     Tenderness: There is no abdominal tenderness. There is no guarding or rebound.  Genitourinary:    General: Normal vulva.     Vagina: No vaginal discharge.     Rectum: Normal.  Musculoskeletal:        General: Normal range of motion.     Cervical back: Normal range of motion and neck supple.  Skin:    General: Skin  is warm and dry.     Capillary Refill: Capillary refill takes less than 2 seconds.     Findings: No erythema or rash.  Neurological:     General: No focal deficit present.     Mental Status: She is alert and oriented to person, place, and time.     Deep Tendon Reflexes: Reflexes normal.  Psychiatric:        Mood and Affect: Mood normal.     ED Results / Procedures / Treatments   Labs (all labs ordered are listed, but only abnormal results are displayed) Results for orders placed or performed during the hospital encounter of 03/14/22  Resp panel by RT-PCR (RSV, Flu A&B, Covid) Nasopharyngeal Swab   Specimen: Nasopharyngeal Swab; Nasal Swab  Result Value Ref Range   SARS Coronavirus 2 by RT PCR NEGATIVE NEGATIVE   Influenza A by PCR NEGATIVE NEGATIVE   Influenza B by PCR NEGATIVE NEGATIVE   Resp Syncytial Virus by PCR NEGATIVE NEGATIVE  Lipase, blood  Result Value Ref Range   Lipase 23 11 - 51 U/L  Comprehensive metabolic panel  Result Value Ref Range   Sodium 134 (L) 135 - 145 mmol/L   Potassium 3.4 (L) 3.5 - 5.1 mmol/L   Chloride 108 98 - 111 mmol/L   CO2 20 (L) 22 - 32 mmol/L   Glucose, Bld 120 (H) 70 - 99 mg/dL   BUN 22 8 - 23 mg/dL   Creatinine, Ser 0.69 0.44 - 1.00 mg/dL   Calcium 8.0 (L) 8.9 - 10.3 mg/dL   Total Protein 7.2 6.5 - 8.1 g/dL   Albumin 3.7 3.5 - 5.0 g/dL   AST 17 15 - 41 U/L   ALT 25 0 - 44 U/L   Alkaline Phosphatase 59 38 - 126 U/L   Total Bilirubin 0.9 0.3 - 1.2 mg/dL   GFR, Estimated >60 >60 mL/min   Anion gap 6 5 - 15  CBC  Result Value Ref Range   WBC 6.8 4.0 - 10.5 K/uL   RBC 3.96 3.87 - 5.11 MIL/uL   Hemoglobin 12.3 12.0 - 15.0 g/dL   HCT 35.7 (L) 36.0 - 46.0 %   MCV 90.2 80.0 - 100.0 fL   MCH 31.1 26.0 - 34.0 pg   MCHC 34.5 30.0 - 36.0 g/dL   RDW 13.9 11.5 - 15.5 %   Platelets 265 150 - 400 K/uL   nRBC 0.0 0.0 - 0.2 %  Urinalysis, Routine w reflex microscopic -Urine, Clean Catch  Result Value Ref Range   Color, Urine YELLOW YELLOW    APPearance HAZY (A) CLEAR   Specific Gravity, Urine 1.025 1.005 - 1.030   pH 5.5 5.0 - 8.0  Glucose, UA NEGATIVE NEGATIVE mg/dL   Hgb urine dipstick NEGATIVE NEGATIVE   Bilirubin Urine SMALL (A) NEGATIVE   Ketones, ur 80 (A) NEGATIVE mg/dL   Protein, ur 30 (A) NEGATIVE mg/dL   Nitrite NEGATIVE NEGATIVE   Leukocytes,Ua NEGATIVE NEGATIVE  Urinalysis, Microscopic (reflex)  Result Value Ref Range   RBC / HPF 0-5 0 - 5 RBC/hpf   WBC, UA 0-5 0 - 5 WBC/hpf   Bacteria, UA FEW (A) NONE SEEN   Squamous Epithelial / HPF 0-5 0 - 5 /HPF   Mucus PRESENT    Hyphae Yeast PRESENT    CT Renal Stone Study  Result Date: 03/14/2022 CLINICAL DATA:  Unspecified abdominal pain, nausea, vomiting EXAM: CT ABDOMEN AND PELVIS WITHOUT CONTRAST TECHNIQUE: Multidetector CT imaging of the abdomen and pelvis was performed following the standard protocol without IV contrast. RADIATION DOSE REDUCTION: This exam was performed according to the departmental dose-optimization program which includes automated exposure control, adjustment of the mA and/or kV according to patient size and/or use of iterative reconstruction technique. COMPARISON:  None Available. FINDINGS: Lower chest: No acute abnormality. Hepatobiliary: Moderate hepatic steatosis. No enhancing intrahepatic mass. No intra or extrahepatic biliary ductal dilation. Gallbladder unremarkable. Pancreas: Unremarkable Spleen: Unremarkable Adrenals/Urinary Tract: Adrenal glands are unremarkable. Kidneys are normal, without renal calculi, focal lesion, or hydronephrosis. Bladder is unremarkable. Stomach/Bowel: Moderate sigmoid diverticulosis. The stomach, small bowel, and large bowel are otherwise unremarkable. No evidence of obstruction or focal inflammation. No free intraperitoneal gas or fluid. Appendix normal. Vascular/Lymphatic: There is shotty mesenteric adenopathy present without frankly pathologic enlargement which may be reactive or inflammatory in nature as can be seen  with underlying gastroenteritis or mesenteric adenitis. The abdominal vasculature is unremarkable on this noncontrast examination. Reproductive: Status post hysterectomy. No adnexal masses. Other: No abdominal wall hernia or abnormality. No abdominopelvic ascites. Musculoskeletal: No acute or significant osseous findings. IMPRESSION: 1. Shotty mesenteric adenopathy without frankly pathologic enlargement which may be reactive or inflammatory in nature as can be seen with underlying gastroenteritis or mesenteric adenitis. 2. Moderate hepatic steatosis. 3. Moderate sigmoid diverticulosis without superimposed acute inflammatory change. Electronically Signed   By: Fidela Salisbury M.D.   On: 03/14/2022 00:35    Radiology CT Renal Stone Study  Result Date: 03/14/2022 CLINICAL DATA:  Unspecified abdominal pain, nausea, vomiting EXAM: CT ABDOMEN AND PELVIS WITHOUT CONTRAST TECHNIQUE: Multidetector CT imaging of the abdomen and pelvis was performed following the standard protocol without IV contrast. RADIATION DOSE REDUCTION: This exam was performed according to the departmental dose-optimization program which includes automated exposure control, adjustment of the mA and/or kV according to patient size and/or use of iterative reconstruction technique. COMPARISON:  None Available. FINDINGS: Lower chest: No acute abnormality. Hepatobiliary: Moderate hepatic steatosis. No enhancing intrahepatic mass. No intra or extrahepatic biliary ductal dilation. Gallbladder unremarkable. Pancreas: Unremarkable Spleen: Unremarkable Adrenals/Urinary Tract: Adrenal glands are unremarkable. Kidneys are normal, without renal calculi, focal lesion, or hydronephrosis. Bladder is unremarkable. Stomach/Bowel: Moderate sigmoid diverticulosis. The stomach, small bowel, and large bowel are otherwise unremarkable. No evidence of obstruction or focal inflammation. No free intraperitoneal gas or fluid. Appendix normal. Vascular/Lymphatic: There is  shotty mesenteric adenopathy present without frankly pathologic enlargement which may be reactive or inflammatory in nature as can be seen with underlying gastroenteritis or mesenteric adenitis. The abdominal vasculature is unremarkable on this noncontrast examination. Reproductive: Status post hysterectomy. No adnexal masses. Other: No abdominal wall hernia or abnormality. No abdominopelvic ascites. Musculoskeletal: No acute or significant osseous findings. IMPRESSION: 1. Shotty  mesenteric adenopathy without frankly pathologic enlargement which may be reactive or inflammatory in nature as can be seen with underlying gastroenteritis or mesenteric adenitis. 2. Moderate hepatic steatosis. 3. Moderate sigmoid diverticulosis without superimposed acute inflammatory change. Electronically Signed   By: Fidela Salisbury M.D.   On: 03/14/2022 00:35    Procedures Procedures    Medications Ordered in ED Medications  ondansetron (ZOFRAN-ODT) disintegrating tablet 8 mg (8 mg Oral Given 03/14/22 0051)  dicyclomine (BENTYL) injection 20 mg (20 mg Intramuscular Given 03/14/22 0050)    ED Course/ Medical Decision Making/ A&P                             Medical Decision Making Patient with constipation and then took dulcolax for bloating and then had nausea vomiting and diarrhea.    Amount and/or Complexity of Data Reviewed Independent Historian: spouse    Details: See above  External Data Reviewed: notes.    Details: Previous notes reviewed  Labs: ordered.    Details: All labs reviewed: negative covid and flu, urine is negative for UTI.  Lipase normal 23, sodium 134, potassium slight low 3.4, normal creatinine, normal LFTs,  normal white count 6.8, normal hemoglobin 12.3 normal platelets  Radiology: ordered and independent interpretation performed.    Details: No free air by me   Risk Prescription drug management. Risk Details: Well appearing, normal exam and vitals CT is consistent with gastroenteritis.   Zofran RX, bland diet x 2 days.  Stable for discharge strict return .      Final Clinical Impression(s) / ED Diagnoses Final diagnoses:  Gastroenteritis   Return for intractable cough, coughing up blood, fevers > 100.4 unrelieved by medication, shortness of breath, intractable vomiting, chest pain, shortness of breath, weakness, numbness, changes in speech, facial asymmetry, abdominal pain, passing out, Inability to tolerate liquids or food, cough, altered mental status or any concerns. No signs of systemic illness or infection. The patient is nontoxic-appearing on exam and vital signs are within normal limits.  I have reviewed the triage vital signs and the nursing notes. Pertinent labs & imaging results that were available during my care of the patient were reviewed by me and considered in my medical decision making (see chart for details). After history, exam, and medical workup I feel the patient has been appropriately medically screened and is safe for discharge home. Pertinent diagnoses were discussed with the patient. Patient was given return precautions Rx / DC Orders ED Discharge Orders          Ordered    dicyclomine (BENTYL) 20 MG tablet  2 times daily        03/14/22 0132    ondansetron (ZOFRAN-ODT) 4 MG disintegrating tablet        03/14/22 0134              Liyah Higham, MD 03/14/22 0140

## 2023-03-10 ENCOUNTER — Ambulatory Visit: Payer: Self-pay | Admitting: Internal Medicine
